# Patient Record
Sex: Male | Born: 1935 | Race: Black or African American | Hispanic: No | Marital: Married | Smoking: Never smoker
Health system: Southern US, Community
[De-identification: ages and names within clinical notes are randomized; demographics above are authoritative.]

## PROBLEM LIST (undated history)

## (undated) DIAGNOSIS — M1990 Unspecified osteoarthritis, unspecified site: Secondary | ICD-10-CM

## (undated) DIAGNOSIS — I10 Essential (primary) hypertension: Secondary | ICD-10-CM

## (undated) DIAGNOSIS — E785 Hyperlipidemia, unspecified: Secondary | ICD-10-CM

## (undated) DIAGNOSIS — G309 Alzheimer's disease, unspecified: Principal | ICD-10-CM

## (undated) DIAGNOSIS — N189 Chronic kidney disease, unspecified: Secondary | ICD-10-CM

## (undated) DIAGNOSIS — I639 Cerebral infarction, unspecified: Secondary | ICD-10-CM

## (undated) DIAGNOSIS — G3184 Mild cognitive impairment, so stated: Principal | ICD-10-CM

## (undated) HISTORY — DX: Alzheimer's disease, unspecified: G30.9

## (undated) HISTORY — DX: Hyperlipidemia, unspecified: E78.5

## (undated) HISTORY — DX: Mild cognitive impairment, so stated: G31.84

## (undated) HISTORY — DX: Unspecified osteoarthritis, unspecified site: M19.90

## (undated) HISTORY — DX: Cerebral infarction, unspecified: I63.9

## (undated) HISTORY — DX: Chronic kidney disease, unspecified: N18.9

## (undated) HISTORY — DX: Essential (primary) hypertension: I10

---

## 2004-11-12 ENCOUNTER — Encounter: Admission: RE | Admit: 2004-11-12 | Discharge: 2004-11-12 | Payer: Self-pay | Admitting: Internal Medicine

## 2005-12-25 ENCOUNTER — Encounter: Payer: Self-pay | Admitting: Family Medicine

## 2005-12-25 LAB — CONVERTED CEMR LAB: PSA: 4.3 ng/mL

## 2006-10-13 ENCOUNTER — Ambulatory Visit: Payer: Self-pay | Admitting: Family Medicine

## 2006-12-15 ENCOUNTER — Ambulatory Visit: Payer: Self-pay | Admitting: Family Medicine

## 2006-12-15 LAB — CONVERTED CEMR LAB
Alkaline Phosphatase: 52 units/L (ref 39–117)
BUN: 15 mg/dL (ref 6–23)
Bilirubin, Direct: 0.1 mg/dL (ref 0.0–0.3)
CO2: 34 meq/L — ABNORMAL HIGH (ref 19–32)
GFR calc Af Amer: 68 mL/min
Potassium: 3.7 meq/L (ref 3.5–5.1)
Total Protein: 6.8 g/dL (ref 6.0–8.3)

## 2007-01-12 ENCOUNTER — Ambulatory Visit: Payer: Self-pay | Admitting: Family Medicine

## 2007-06-11 ENCOUNTER — Encounter (INDEPENDENT_AMBULATORY_CARE_PROVIDER_SITE_OTHER): Payer: Self-pay | Admitting: *Deleted

## 2007-06-14 ENCOUNTER — Encounter: Payer: Self-pay | Admitting: Family Medicine

## 2007-06-14 DIAGNOSIS — Z8679 Personal history of other diseases of the circulatory system: Secondary | ICD-10-CM | POA: Insufficient documentation

## 2007-06-14 DIAGNOSIS — M1990 Unspecified osteoarthritis, unspecified site: Secondary | ICD-10-CM | POA: Insufficient documentation

## 2007-06-14 DIAGNOSIS — E785 Hyperlipidemia, unspecified: Secondary | ICD-10-CM | POA: Insufficient documentation

## 2007-06-14 DIAGNOSIS — I10 Essential (primary) hypertension: Secondary | ICD-10-CM | POA: Insufficient documentation

## 2007-06-15 ENCOUNTER — Ambulatory Visit: Payer: Self-pay | Admitting: Family Medicine

## 2007-06-15 LAB — CONVERTED CEMR LAB
Cholesterol, target level: 200 mg/dL
LDL Goal: 100 mg/dL

## 2007-06-16 DIAGNOSIS — R7309 Other abnormal glucose: Secondary | ICD-10-CM

## 2007-06-16 LAB — CONVERTED CEMR LAB
ALT: 20 units/L (ref 0–53)
AST: 25 units/L (ref 0–37)
BUN: 16 mg/dL (ref 6–23)
Calcium: 9.5 mg/dL (ref 8.4–10.5)
Chloride: 104 meq/L (ref 96–112)
Creatinine, Ser: 1.3 mg/dL (ref 0.4–1.5)
HDL: 41.6 mg/dL (ref >39.0)
VLDL: 17 mg/dL (ref 0–40)

## 2007-06-30 ENCOUNTER — Ambulatory Visit: Payer: Self-pay | Admitting: Family Medicine

## 2007-06-30 LAB — CONVERTED CEMR LAB
BUN: 18 mg/dL (ref 6–23)
Calcium: 9.4 mg/dL (ref 8.4–10.5)
Chloride: 105 meq/L (ref 96–112)
GFR calc Af Amer: 62 mL/min
GFR calc non Af Amer: 51 mL/min

## 2007-08-13 ENCOUNTER — Ambulatory Visit: Payer: Self-pay | Admitting: Family Medicine

## 2007-08-16 ENCOUNTER — Ambulatory Visit: Payer: Self-pay | Admitting: Internal Medicine

## 2007-08-16 LAB — CONVERTED CEMR LAB
Bilirubin Urine: NEGATIVE
Ketones, urine, test strip: NEGATIVE
Urobilinogen, UA: 0.2
pH: 7

## 2007-08-17 ENCOUNTER — Encounter: Payer: Self-pay | Admitting: Internal Medicine

## 2007-08-31 ENCOUNTER — Encounter (INDEPENDENT_AMBULATORY_CARE_PROVIDER_SITE_OTHER): Payer: Self-pay | Admitting: *Deleted

## 2007-09-01 ENCOUNTER — Ambulatory Visit: Payer: Self-pay | Admitting: Family Medicine

## 2007-09-16 ENCOUNTER — Ambulatory Visit: Payer: Self-pay | Admitting: Family Medicine

## 2007-09-16 LAB — CONVERTED CEMR LAB
CO2: 32 meq/L (ref 19–32)
Chloride: 104 meq/L (ref 96–112)
GFR calc non Af Amer: 51 mL/min
Glucose, Bld: 100 mg/dL — ABNORMAL HIGH (ref 70–99)
Sodium: 142 meq/L (ref 135–145)

## 2007-12-30 ENCOUNTER — Ambulatory Visit: Payer: Self-pay | Admitting: Family Medicine

## 2008-01-04 LAB — CONVERTED CEMR LAB
ALT: 24 units/L (ref 0–53)
AST: 29 units/L (ref 0–37)
Basophils Relative: 0.4 % (ref 0.0–1.0)
Bilirubin, Direct: 0.1 mg/dL (ref 0.0–0.3)
CO2: 34 meq/L — ABNORMAL HIGH (ref 19–32)
Calcium: 9.7 mg/dL (ref 8.4–10.5)
Chloride: 102 meq/L (ref 96–112)
Creatinine, Ser: 1.2 mg/dL (ref 0.4–1.5)
Eosinophils Absolute: 0.1 10*3/uL (ref 0.0–0.6)
Eosinophils Relative: 2.2 % (ref 0.0–5.0)
GFR calc non Af Amer: 61 mL/min
Glucose, Bld: 92 mg/dL (ref 70–99)
HCT: 47.4 % (ref 39.0–52.0)
MCV: 93.3 fL (ref 78.0–100.0)
Neutrophils Relative %: 39.4 % — ABNORMAL LOW (ref 43.0–77.0)
Platelets: 267 10*3/uL (ref 150–400)
RBC: 5.09 M/uL (ref 4.22–5.81)
RDW: 12.9 % (ref 11.5–14.6)
Sodium: 141 meq/L (ref 135–145)
Total Bilirubin: 0.8 mg/dL (ref 0.3–1.2)
Total CHOL/HDL Ratio: 3.7
Total Protein: 7.4 g/dL (ref 6.0–8.3)
Triglycerides: 88 mg/dL (ref 0–149)
VLDL: 18 mg/dL (ref 0–40)
Vitamin B-12: 673 pg/mL (ref 211–911)
WBC: 4.5 10*3/uL (ref 4.5–10.5)

## 2008-01-27 ENCOUNTER — Ambulatory Visit: Payer: Self-pay | Admitting: Family Medicine

## 2008-04-26 ENCOUNTER — Ambulatory Visit: Payer: Self-pay | Admitting: Family Medicine

## 2008-05-03 ENCOUNTER — Ambulatory Visit: Payer: Self-pay | Admitting: Family Medicine

## 2008-05-05 LAB — CONVERTED CEMR LAB
BUN: 14 mg/dL (ref 6–23)
Creatinine, Ser: 1.3 mg/dL (ref 0.4–1.5)
GFR calc Af Amer: 67 mL/min
GFR calc non Af Amer: 56 mL/min
Glucose, Bld: 105 mg/dL — ABNORMAL HIGH (ref 70–99)
Potassium: 4.4 meq/L (ref 3.5–5.1)

## 2008-05-21 ENCOUNTER — Ambulatory Visit: Payer: Self-pay | Admitting: Internal Medicine

## 2008-05-21 ENCOUNTER — Inpatient Hospital Stay (HOSPITAL_COMMUNITY): Admission: EM | Admit: 2008-05-21 | Discharge: 2008-05-28 | Payer: Self-pay | Admitting: Emergency Medicine

## 2008-05-23 ENCOUNTER — Ambulatory Visit: Payer: Self-pay | Admitting: Infectious Disease

## 2008-05-25 ENCOUNTER — Encounter: Payer: Self-pay | Admitting: Internal Medicine

## 2008-05-26 ENCOUNTER — Encounter: Payer: Self-pay | Admitting: Family Medicine

## 2008-05-30 ENCOUNTER — Ambulatory Visit: Payer: Self-pay | Admitting: Family Medicine

## 2008-05-31 LAB — CONVERTED CEMR LAB
Alkaline Phosphatase: 60 units/L (ref 39–117)
Basophils Absolute: 0 10*3/uL (ref 0.0–0.1)
Bilirubin, Direct: 0.1 mg/dL (ref 0.0–0.3)
Calcium: 8.8 mg/dL (ref 8.4–10.5)
GFR calc Af Amer: 67 mL/min
HCT: 34.6 % — ABNORMAL LOW (ref 39.0–52.0)
Hemoglobin: 12.2 g/dL — ABNORMAL LOW (ref 13.0–17.0)
Lymphocytes Relative: 30.6 % (ref 12.0–46.0)
MCHC: 35.3 g/dL (ref 30.0–36.0)
Monocytes Absolute: 1 10*3/uL (ref 0.1–1.0)
Neutro Abs: 4.1 10*3/uL (ref 1.4–7.7)
Platelets: 417 10*3/uL — ABNORMAL HIGH (ref 150–400)
Potassium: 3.9 meq/L (ref 3.5–5.1)
RDW: 13.2 % (ref 11.5–14.6)
Sodium: 146 meq/L — ABNORMAL HIGH (ref 135–145)
Total Bilirubin: 0.8 mg/dL (ref 0.3–1.2)

## 2008-06-01 ENCOUNTER — Telehealth: Payer: Self-pay | Admitting: Family Medicine

## 2008-06-05 ENCOUNTER — Ambulatory Visit: Payer: Self-pay | Admitting: Family Medicine

## 2008-06-06 ENCOUNTER — Telehealth: Payer: Self-pay | Admitting: Family Medicine

## 2008-06-13 ENCOUNTER — Ambulatory Visit: Payer: Self-pay | Admitting: Internal Medicine

## 2008-06-13 ENCOUNTER — Ambulatory Visit: Payer: Self-pay | Admitting: Family Medicine

## 2008-06-14 ENCOUNTER — Ambulatory Visit: Payer: Self-pay | Admitting: Family Medicine

## 2008-06-15 ENCOUNTER — Encounter: Payer: Self-pay | Admitting: Internal Medicine

## 2008-06-16 LAB — CONVERTED CEMR LAB
ALT: 32 units/L (ref 0–53)
AST: 28 units/L (ref 0–37)
Alkaline Phosphatase: 58 units/L (ref 39–117)
Bilirubin, Direct: 0.1 mg/dL (ref 0.0–0.3)
CO2: 33 meq/L — ABNORMAL HIGH (ref 19–32)
Chloride: 103 meq/L (ref 96–112)
GFR calc Af Amer: 57 mL/min
Glucose, Bld: 114 mg/dL — ABNORMAL HIGH (ref 70–99)
HCT: 44.8 % (ref 39.0–52.0)
Hemoglobin: 15.1 g/dL (ref 13.0–17.0)
MCHC: 33.7 g/dL (ref 30.0–36.0)
MCV: 94.1 fL (ref 78.0–100.0)
Potassium: 4.5 meq/L (ref 3.5–5.1)
RBC: 4.76 M/uL (ref 4.22–5.81)
Sodium: 142 meq/L (ref 135–145)
Total Protein: 7.1 g/dL (ref 6.0–8.3)

## 2008-06-30 ENCOUNTER — Encounter: Payer: Self-pay | Admitting: Internal Medicine

## 2008-07-05 ENCOUNTER — Encounter: Payer: Self-pay | Admitting: Family Medicine

## 2008-07-27 ENCOUNTER — Encounter: Payer: Self-pay | Admitting: Family Medicine

## 2008-07-27 ENCOUNTER — Encounter (INDEPENDENT_AMBULATORY_CARE_PROVIDER_SITE_OTHER): Payer: Self-pay | Admitting: *Deleted

## 2008-07-28 ENCOUNTER — Ambulatory Visit: Payer: Self-pay | Admitting: Family Medicine

## 2008-07-31 ENCOUNTER — Telehealth: Payer: Self-pay | Admitting: Family Medicine

## 2008-08-08 ENCOUNTER — Ambulatory Visit: Payer: Self-pay | Admitting: Family Medicine

## 2008-08-09 LAB — CONVERTED CEMR LAB
BUN: 16 mg/dL (ref 6–23)
Calcium: 9.5 mg/dL (ref 8.4–10.5)
Chloride: 100 meq/L (ref 96–112)
Creatinine, Ser: 1.3 mg/dL (ref 0.4–1.5)
GFR calc non Af Amer: 56 mL/min

## 2008-09-18 ENCOUNTER — Encounter: Payer: Self-pay | Admitting: Internal Medicine

## 2008-09-24 IMAGING — CT CT ABDOMEN W/ CM
2 of 5 series · 14 of 32 positions shown, 19 images · IV contrast (omni 300/water & 80 ml omni 300)
Comparison: Ultrasound 05/22/2008

CT ABDOMEN

CLINICAL DATA: Sepsis

CT ABDOMEN AND PELVIS WITH CONTRAST
TECHNIQUE: Multidetector CT imaging of the abdomen and pelvis was
performed using the standard protocol following bolus
administration of intravenous contrast.
Contrast: 88 ml [id number]-6LL

[Series 2: routine abdomen · axial · 0.77mm/px · z∈[-409,-84]mm · 6 of 93 slices shown, 11 images]
[im 14/93  soft-tissue]
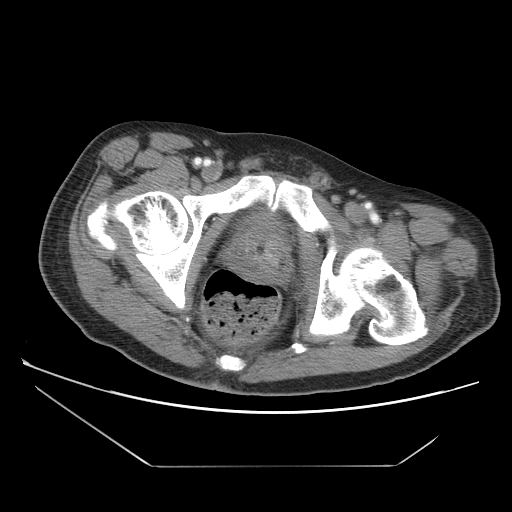
[im 14/93  bone]
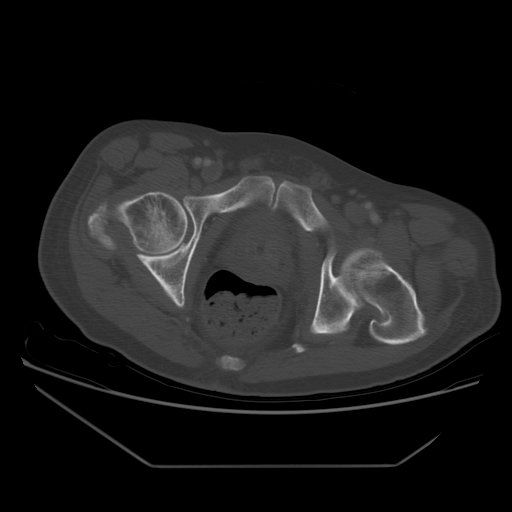
[im 27/93  soft-tissue]
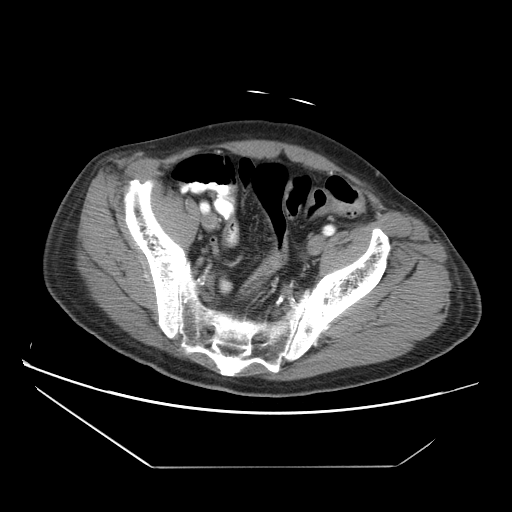
[im 40/93  soft-tissue]
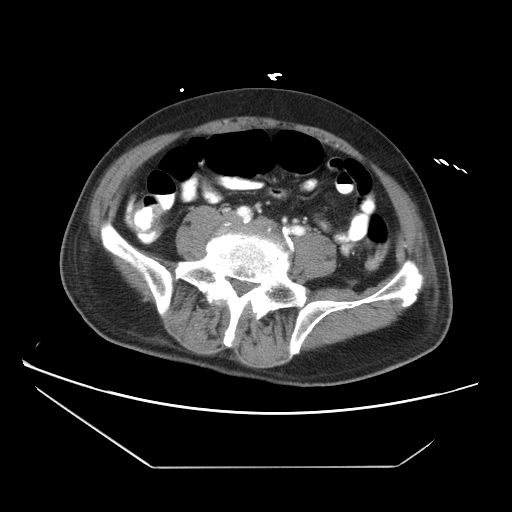
[im 40/93  lung]
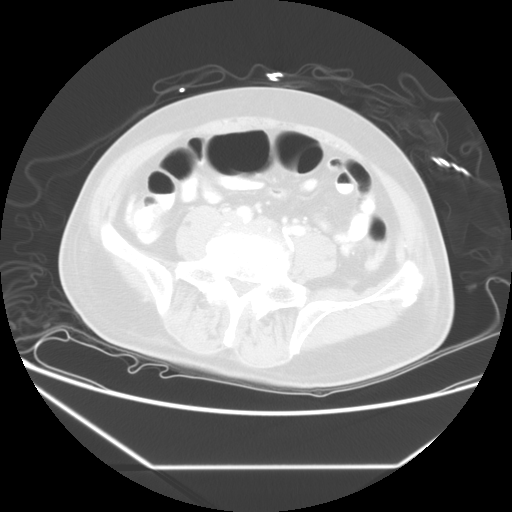
[im 53/93  soft-tissue]
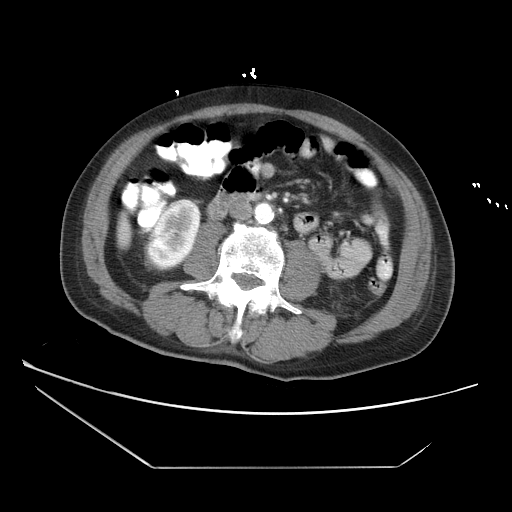
[im 53/93  lung]
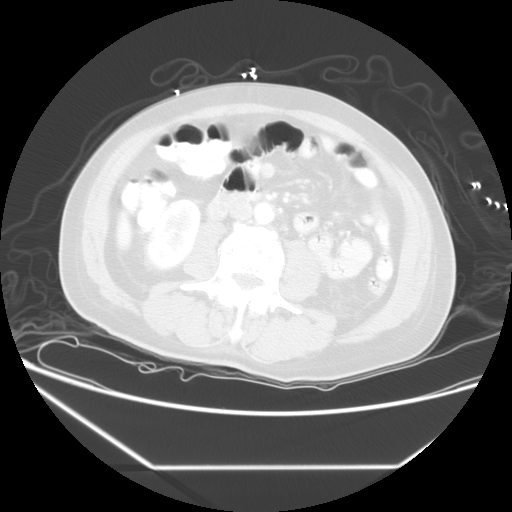
[im 66/93  soft-tissue]
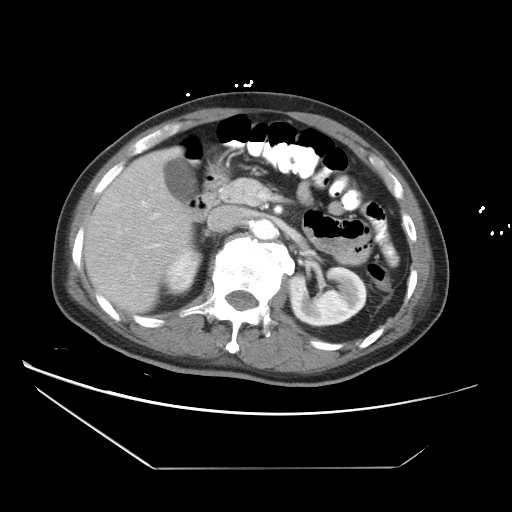
[im 66/93  lung]
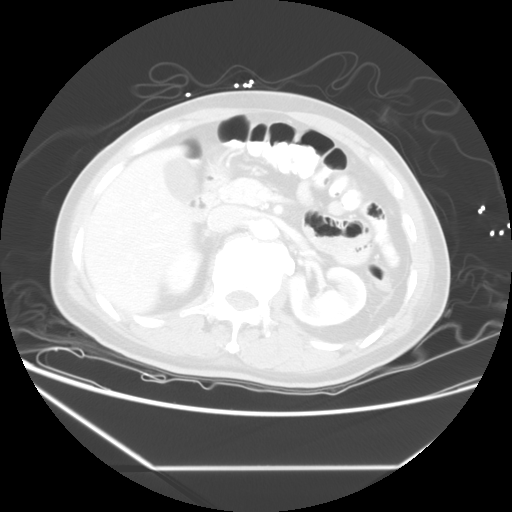
[im 79/93  soft-tissue]
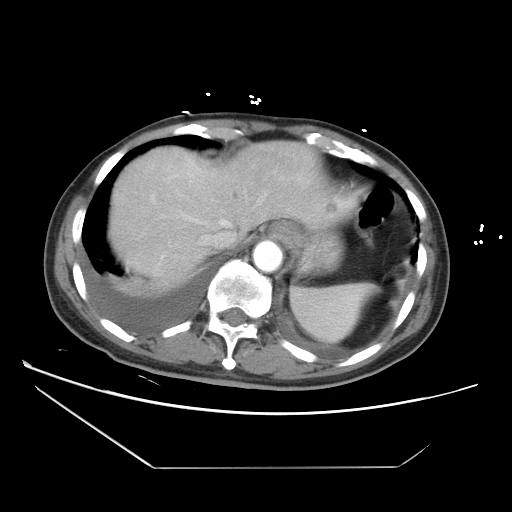
[im 79/93  lung]
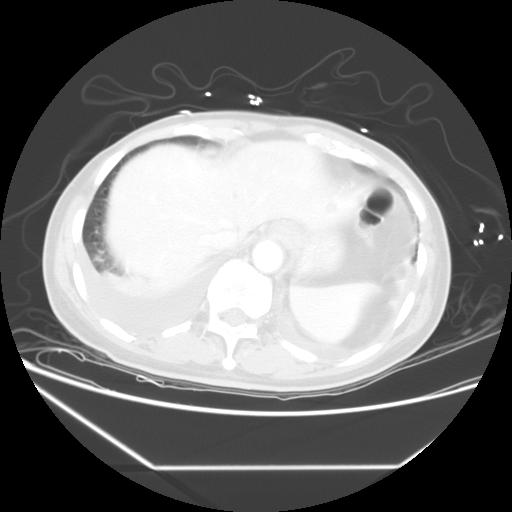

[Series 400: reformatted · sagittal · 0.90mm/px · 8 of 106 slices shown]
[im 12/106  soft-tissue]
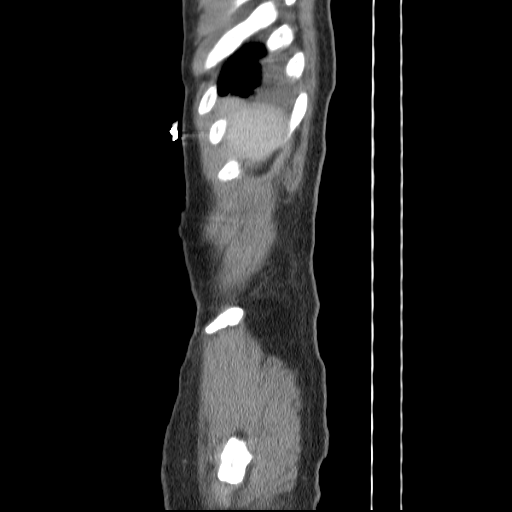
[im 24/106  soft-tissue]
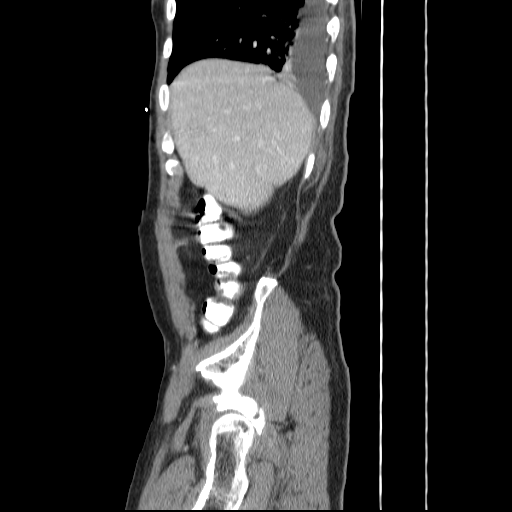
[im 36/106  soft-tissue]
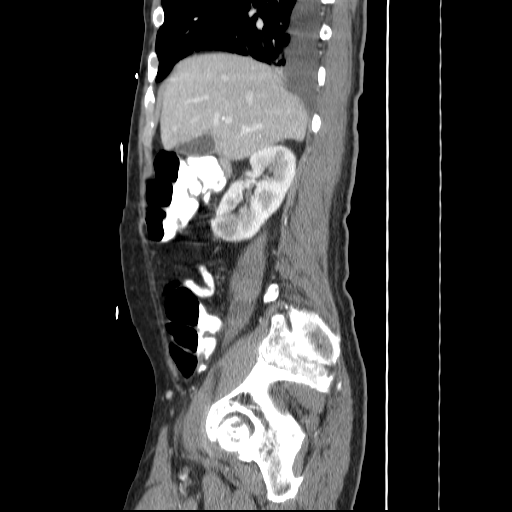
[im 47/106  soft-tissue]
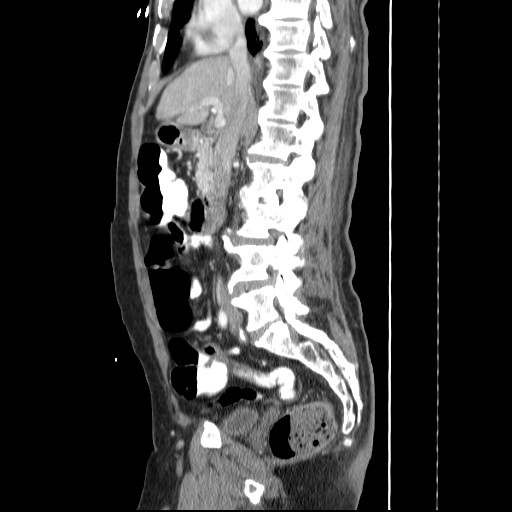
[im 59/106  soft-tissue]
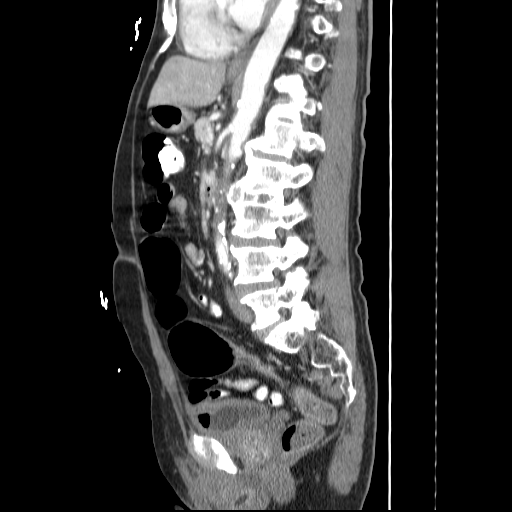
[im 71/106  soft-tissue]
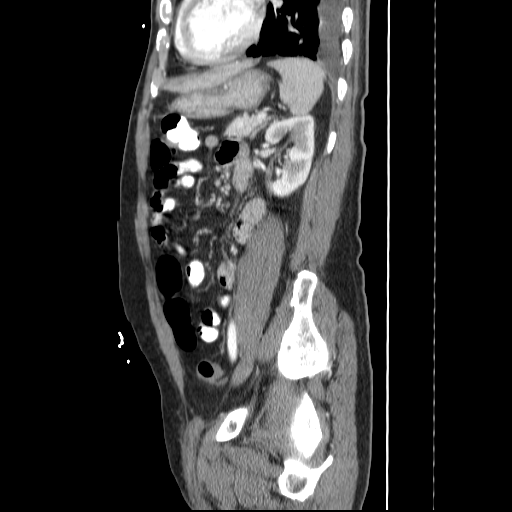
[im 82/106  soft-tissue]
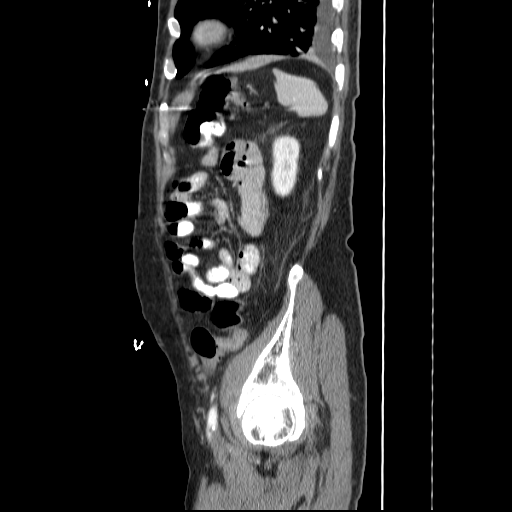
[im 94/106  soft-tissue]
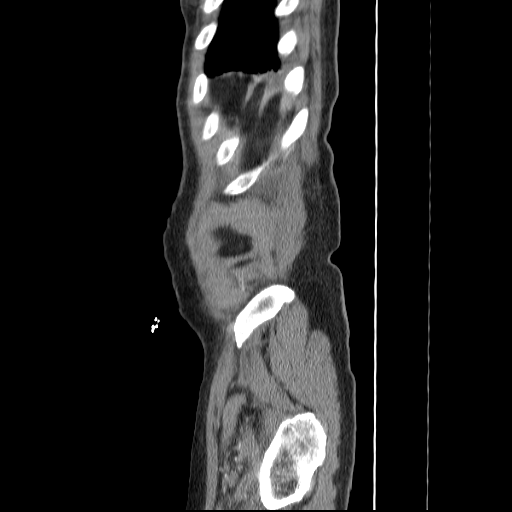

[14 of 32 positions shown; findings below may reference images not displayed]

FINDINGS: There are moderate bilateral pleural effusions, right
slightly larger than left.  Compressive atelectasis in the lower
lobes bilaterally.  Heart borderline in size.  Mild vascular
congestion.

Tiny low density areas in the dome of the liver, difficult to
characterize due to their small size but statistically most likely
small cysts.  Spleen, pancreas, adrenals, kidneys unremarkable.
Gallbladder grossly unremarkable.

Bowel grossly unremarkable.  No free fluid, free air, or
adenopathy. Atherosclerotic irregularity calcifications in the
aorta.  No aneurysm.

Degenerative changes in the spine.  No acute bony abnormality.
IMPRESSION: Moderate bilateral effusions and bibasilar atelectasis.

No acute findings in the abdomen.

CT PELVIS
FINDINGS: Foley catheter present within the bladder.  Appendix is
visualized and is normal. Bowel grossly unremarkable.  No free
fluid, free air, or adenopathy.

No acute bony abnormality.
IMPRESSION: No acute findings in the pelvis.

## 2008-10-31 ENCOUNTER — Ambulatory Visit: Payer: Self-pay | Admitting: Family Medicine

## 2008-10-31 LAB — CONVERTED CEMR LAB
Alkaline Phosphatase: 43 units/L (ref 39–117)
Bilirubin, Direct: 0.1 mg/dL (ref 0.0–0.3)
GFR calc Af Amer: 62 mL/min
GFR calc non Af Amer: 51 mL/min
Glucose, Bld: 107 mg/dL — ABNORMAL HIGH (ref 70–99)
Potassium: 3.8 meq/L (ref 3.5–5.1)
Sodium: 139 meq/L (ref 135–145)
VLDL: 28 mg/dL (ref 0–40)

## 2008-11-08 ENCOUNTER — Ambulatory Visit: Payer: Self-pay | Admitting: Family Medicine

## 2009-02-05 ENCOUNTER — Ambulatory Visit: Payer: Self-pay | Admitting: Family Medicine

## 2009-02-05 LAB — CONVERTED CEMR LAB
HDL: 39.8 mg/dL (ref >39.00)
Total CHOL/HDL Ratio: 4
VLDL: 19.8 mg/dL (ref 0.0–40.0)

## 2009-02-07 ENCOUNTER — Ambulatory Visit: Payer: Self-pay | Admitting: Family Medicine

## 2009-02-07 LAB — CONVERTED CEMR LAB: LDL Goal: 130 mg/dL

## 2009-05-14 ENCOUNTER — Ambulatory Visit: Payer: Self-pay | Admitting: Family Medicine

## 2009-05-14 LAB — HM COLONOSCOPY

## 2009-06-05 ENCOUNTER — Ambulatory Visit: Payer: Self-pay | Admitting: Family Medicine

## 2009-08-10 ENCOUNTER — Ambulatory Visit: Payer: Self-pay | Admitting: Family Medicine

## 2009-08-13 LAB — CONVERTED CEMR LAB
Albumin: 3.8 g/dL (ref 3.5–5.2)
CO2: 32 meq/L (ref 19–32)
Chloride: 101 meq/L (ref 96–112)
HDL: 41.5 mg/dL (ref >39.00)
LDL Cholesterol: 88 mg/dL (ref 0–99)
Sodium: 140 meq/L (ref 135–145)
Total CHOL/HDL Ratio: 4
Triglycerides: 85 mg/dL (ref 0.0–149.0)

## 2009-08-14 ENCOUNTER — Ambulatory Visit: Payer: Self-pay | Admitting: Family Medicine

## 2009-11-16 ENCOUNTER — Ambulatory Visit: Payer: Self-pay | Admitting: Family Medicine

## 2010-02-13 ENCOUNTER — Ambulatory Visit: Payer: Self-pay | Admitting: Family Medicine

## 2010-02-13 LAB — CONVERTED CEMR LAB
Bilirubin, Direct: 0.1 mg/dL (ref 0.0–0.3)
CO2: 33 meq/L — ABNORMAL HIGH (ref 19–32)
Calcium: 9.3 mg/dL (ref 8.4–10.5)
Cholesterol: 140 mg/dL (ref 0–200)
Creatinine, Ser: 1.9 mg/dL — ABNORMAL HIGH (ref 0.4–1.5)
HDL: 40.7 mg/dL (ref >39.00)
Total Bilirubin: 0.6 mg/dL (ref 0.3–1.2)
Total CHOL/HDL Ratio: 3
Total Protein: 7 g/dL (ref 6.0–8.3)
Triglycerides: 141 mg/dL (ref 0.0–149.0)

## 2010-02-19 ENCOUNTER — Ambulatory Visit: Payer: Self-pay | Admitting: Family Medicine

## 2010-02-19 LAB — CONVERTED CEMR LAB
Blood in Urine, dipstick: NEGATIVE
Nitrite: NEGATIVE
Specific Gravity, Urine: <1.005
Urobilinogen, UA: 0.2
WBC Urine, dipstick: NEGATIVE

## 2010-02-26 ENCOUNTER — Ambulatory Visit: Payer: Self-pay | Admitting: Family Medicine

## 2010-02-27 LAB — CONVERTED CEMR LAB
BUN: 32 mg/dL — ABNORMAL HIGH (ref 6–23)
CO2: 31 meq/L (ref 19–32)
Chloride: 102 meq/L (ref 96–112)
Creatinine, Ser: 1.8 mg/dL — ABNORMAL HIGH (ref 0.4–1.5)
Glucose, Bld: 110 mg/dL — ABNORMAL HIGH (ref 70–99)

## 2010-05-30 ENCOUNTER — Ambulatory Visit: Payer: Self-pay | Admitting: Family Medicine

## 2010-11-25 ENCOUNTER — Telehealth (INDEPENDENT_AMBULATORY_CARE_PROVIDER_SITE_OTHER): Payer: Self-pay | Admitting: *Deleted

## 2010-11-27 ENCOUNTER — Other Ambulatory Visit: Payer: Self-pay | Admitting: Family Medicine

## 2010-11-27 ENCOUNTER — Encounter (INDEPENDENT_AMBULATORY_CARE_PROVIDER_SITE_OTHER): Payer: Self-pay | Admitting: *Deleted

## 2010-11-27 ENCOUNTER — Ambulatory Visit: Admit: 2010-11-27 | Payer: Self-pay | Admitting: Family Medicine

## 2010-11-27 ENCOUNTER — Other Ambulatory Visit (INDEPENDENT_AMBULATORY_CARE_PROVIDER_SITE_OTHER): Payer: MEDICARE

## 2010-11-27 DIAGNOSIS — I10 Essential (primary) hypertension: Secondary | ICD-10-CM

## 2010-11-27 LAB — BASIC METABOLIC PANEL
BUN: 24 mg/dL — ABNORMAL HIGH (ref 6–23)
CO2: 32 mEq/L (ref 19–32)
Calcium: 9.8 mg/dL (ref 8.4–10.5)
Creatinine, Ser: 1.7 mg/dL — ABNORMAL HIGH (ref 0.4–1.5)
GFR: 47.82 mL/min — ABNORMAL LOW (ref >60.00)
Glucose, Bld: 90 mg/dL (ref 70–99)
Sodium: 141 mEq/L (ref 135–145)

## 2010-11-27 LAB — HEPATIC FUNCTION PANEL
Albumin: 3.9 g/dL (ref 3.5–5.2)
Total Protein: 7.1 g/dL (ref 6.0–8.3)

## 2010-11-28 NOTE — Assessment & Plan Note (Signed)
Summary: NURSE VISIT BP CHECK/[facility]  Nurse Visit   Vital Signs:  Patient profile:   77 year old male Height:      67 inches Temp:     97.5 degrees F oral BP sitting:   140 / 80  (right arm) Cuff size:   regular  Vitals Entered By: Linde Gillis CMA Duncan Dull) (Feb 26, 2010 10:32 AM) CC: blood pressure check  Patient said that he checked his blood pressure two days ago at [facility] and it was 130/80.  Linde Gillis CMA Duncan Dull)  Feb 26, 2010 10:34 AM   Please have pt continue to follow at home. No med change. Kerby Nora MD  Feb 26, 2010 10:59 AM    Allergies: No Known Drug Allergies

## 2010-11-28 NOTE — Assessment & Plan Note (Signed)
Summary: 30 MIN APPT 3 MONTH FOLLWO UPR/BH   Vital Signs:  Patient profile:   76 year old male Height:      67 inches Weight:      149.2 pounds Temp:     98.1 degrees F oral Pulse rate:   76 / minute Pulse rhythm:   regular BP sitting:   140 / 88  (left arm) Cuff size:   regular  Vitals Entered By: Benny Lennert CMA Duncan Dull) (February 19, 2010 10:53 AM)  History of Present Illness: Chief complaint 3 month follow up  Renal insufficieny...he states he is not drinking much fluids..has tried to increase. No new medciane. No NSAIds. Refuses lab reeval today.Marland Kitchen"I don't want to be stuck again. Given me longer"  Hypertension History:      He denies headache, chest pain, palpitations, dyspnea with exertion, orthopnea, PND, peripheral edema, neurologic problems, syncope, and side effects from treatment.  Not checking at home..took meds 1 hour ago.  Has eaten some high salt foods lately..pigs feet.        Positive major cardiovascular risk factors include male age 45 years old or older, hyperlipidemia, and hypertension.  Negative major cardiovascular risk factors include non-tobacco-user status.     Problems Prior to Update: 1)  Pure Hypercholesterolemia  (ICD-272.0) 2)  Prediabetes  (ICD-790.29) 3)  Degenerative Disc Disease, Neck  (ICD-722.6) 4)  Transient Ischemic Attack, Hx of  (ICD-V12.50) 5)  Renal Insufficiency  (ICD-588.9) 6)  Osteoarthritis  (ICD-715.90) 7)  Hypertension  (ICD-401.9) 8)  Hyperlipidemia  (ICD-272.4)  Current Medications (verified): 1)  Multivitamins   Tabs (Multiple Vitamin) .... Once Daily 2)  Adult Aspirin Ec Low Strength 81 Mg  Tbec (Aspirin) .... Once Daily 3)  Coreg 6.25 Mg  Tabs (Carvedilol) .Marland Kitchen.. 1 Tab By Mouth Two Times A Day 4)  Lisinopril-Hydrochlorothiazide 20-12.5 Mg  Tabs (Lisinopril-Hydrochlorothiazide) .... Take 2 Tablet By Mouth Once A Day 5)  Simvastatin 20 Mg  Tabs (Simvastatin) .Marland Kitchen.. 1 Tab By Mouth Daily  Allergies (verified): No Known Drug  Allergies  Past History:  Past medical, surgical, family and social histories (including risk factors) reviewed, and no changes noted (except as noted below).  Past Medical History: Reviewed history from 06/14/2007 and no changes required. Hyperlipidemia Hypertension Osteoarthritis Renal insufficiency Transient ischemic attack, hx of  Family History: Reviewed history from 06/14/2007 and no changes required. Father: Died 60, healthy, MI Mother: Died 85 [place] Siblings: 1 brother ? 3 sisters, 2 died of old age, one alive at age 52  Social History: Reviewed history from 06/14/2007 and no changes required. Never Smoked Alcohol use-no Drug use-no Regular exercise-yes, gardening, very active Diet:  Fair eater, (+) fruit/veggies but only eats once a day, (+) H2O Marital Status: Married x 53 years Children: 6, healthy Occupation: Paediatric nurse, truck Hospital doctor  Review of Systems General:  Denies fatigue and fever. CV:  Denies chest pain or discomfort. Resp:  Denies chest pain with inspiration, shortness of breath, sputum productive, and wheezing. GI:  Denies abdominal pain, bloody stools, constipation, and diarrhea. GU:  Denies dysuria.  Physical Exam  General:  elderly male in NAd Nose:  External nasal examination shows no deformity or inflammation. Nasal mucosa are pink and moist without lesions or exudates. Mouth:  MMM Neck:  no carotid bruit or thyromegaly no cervical or supraclavicular lymphadenopathy  Lungs:  Normal respiratory effort, chest expands symmetrically. Lungs are clear to auscultation, no crackles or wheezes. Heart:  Normal rate and regular rhythm. S1 and S2 normal without  gallop, murmur, click, rub or other extra sounds. Abdomen:  Bowel sounds positive,abdomen soft and non-tender without masses, organomegaly or hernias noted. Pulses:  R and L posterior tibial pulses are full and equal bilaterally  Extremities:  no edema   Impression & Recommendations:  Problem  # 1:  HYPERTENSION (ICD-401.9) Initail check 170/90.Marland Kitchenrecehck lower at 140. Follow at home. Call  if above goal 140/90. ? connected to recent decrease in kidney function.  Encouraged exercise, weight loss, healthy eating habits.  His updated medication list for this problem includes:    Coreg 6.25 Mg Tabs (Carvedilol) .Marland Kitchen... 1 tab by mouth two times a day    Lisinopril-hydrochlorothiazide 20-12.5 Mg Tabs (Lisinopril-hydrochlorothiazide) .Marland Kitchen... Take 2 tablet by mouth once a day  Problem # 2:  PURE HYPERCHOLESTEROLEMIA (ICD-272.0)  Well controlled. Continue current medication.  His updated medication list for this problem includes:    Simvastatin 20 Mg Tabs (Simvastatin) .Marland Kitchen... 1 tab by mouth daily  Labs Reviewed: SGOT: 21 (02/13/2010)   SGPT: 18 (02/13/2010)  Lipid Goals: Chol Goal: 200 (06/15/2007)   HDL Goal: 40 (06/15/2007)   LDL Goal: 130 (02/07/2009)   TG Goal: 150 (06/15/2007)  10 Yr Risk Heart Disease: 18 % Prior 10 Yr Risk Heart Disease: 14 % (11/16/2009)   HDL:40.70 (02/13/2010), 41.50 (08/10/2009)  LDL:71 (02/13/2010), 88 (08/10/2009)  Chol:140 (02/13/2010), 146 (08/10/2009)  Trig:141.0 (02/13/2010), 85.0 (08/10/2009)  Problem # 3:  RENAL INSUFFICIENCY (ICD-588.9) No sign of infection.  Refuses reeval today..recheck in 1 week for worsening. Avoid NSAIDs. Increase water intake..? mild dehydration. Orders: UA Dipstick W/ Micro (manual) (16109)  Problem # 4:  PREDIABETES (ICD-790.29) Well controlled.  Labs Reviewed: Creat: 1.9 (02/13/2010)     Complete Medication List: 1)  Multivitamins Tabs (Multiple vitamin) .... Once daily 2)  Adult Aspirin Ec Low Strength 81 Mg Tbec (Aspirin) .... Once daily 3)  Coreg 6.25 Mg Tabs (Carvedilol) .Marland Kitchen.. 1 tab by mouth two times a day 4)  Lisinopril-hydrochlorothiazide 20-12.5 Mg Tabs (Lisinopril-hydrochlorothiazide) .... Take 2 tablet by mouth once a day 5)  Simvastatin 20 Mg Tabs (Simvastatin) .Marland Kitchen.. 1 tab by mouth daily  Hypertension  Assessment/Plan:      The patient's hypertensive risk group is category B: At least one risk factor (excluding diabetes) with no target organ damage.  His calculated 10 year risk of coronary heart disease is 18 %.  Today's blood pressure is 140/88.  His blood pressure goal is < 140/90.  Patient Instructions: 1)  Return in 1 week for BMET Dx 401.1 2)  RN vist same day for BP check. 3)   Follow up in 27month 30 min OV. 4)  Check BP at pharmacy in next 2 weeks. 5)  Call if greater than 140/90.  Current Allergies (reviewed today): No known allergies   Laboratory Results   Urine Tests  Date/Time Received: February 19, 2010 11:36 AM  Date/Time Reported: February 19, 2010 11:36 AM   Routine Urinalysis   Color: lt. yellow Appearance: Clear Glucose: negative   (Normal Range: Negative) Bilirubin: negative   (Normal Range: Negative) Ketone: negative   (Normal Range: Negative) Spec. Gravity: <1.005   (Normal Range: 1.003-1.035) Blood: negative   (Normal Range: Negative) pH: 5.0   (Normal Range: 5.0-8.0) Protein: negative   (Normal Range: Negative) Urobilinogen: 0.2   (Normal Range: 0-1) Nitrite: negative   (Normal Range: Negative) Leukocyte Esterace: negative   (Normal Range: Negative)

## 2010-11-28 NOTE — Assessment & Plan Note (Signed)
Summary: 30 MIN APPT 3 MONTH FOLLOW UP/[facility]   Vital Signs:  Patient profile:   77 year old male Height:      67 inches Weight:      137.8 pounds BMI:     21.66 Temp:     98.9 degrees F oral Pulse rate:   76 / minute Pulse rhythm:   regular BP sitting:   138 / 80  (left arm) Cuff size:   regular  Vitals Entered By: Benny Lennert CMA Duncan Dull) (May 30, 2010 8:41 AM)  History of Present Illness: Chief complaint 3 month follow up   HTN, well controlled on current medicaiton.  Recent eye MD exam..has cataracts in both eyes...recommended surgery.  Last labs.Marland Kitchenelectrolytes and kidney fnction stable.  Working outside in garden...drinking fluids.   Discussed end of life issues: He wishes to be FULL CODE. Info given on living will and DNR, HCPOA.  He will discuss with family...we will set up end of life plans at next OV.  Problems Prior to Update: 1)  Pure Hypercholesterolemia  (ICD-272.0) 2)  Prediabetes  (ICD-790.29) 3)  Degenerative Disc Disease, Neck  (ICD-722.6) 4)  Transient Ischemic Attack, Hx of  (ICD-V12.50) 5)  Renal Insufficiency  (ICD-588.9) 6)  Osteoarthritis  (ICD-715.90) 7)  Hypertension  (ICD-401.9) 8)  Hyperlipidemia  (ICD-272.4)  Current Medications (verified): 1)  Multivitamins   Tabs (Multiple Vitamin) .... Once Daily 2)  Adult Aspirin Ec Low Strength 81 Mg  Tbec (Aspirin) .... Once Daily 3)  Coreg 6.25 Mg  Tabs (Carvedilol) .Marland Kitchen.. 1 Tab By Mouth Two Times A Day 4)  Lisinopril-Hydrochlorothiazide 20-12.5 Mg  Tabs (Lisinopril-Hydrochlorothiazide) .... Take 2 Tablet By Mouth Once A Day 5)  Simvastatin 20 Mg  Tabs (Simvastatin) .Marland Kitchen.. 1 Tab By Mouth Daily  Allergies (verified): No Known Drug Allergies  Past History:  Past medical, surgical, family and social histories (including risk factors) reviewed, and no changes noted (except as noted below).  Past Medical History: Reviewed history from 06/14/2007 and no changes  required. Hyperlipidemia Hypertension Osteoarthritis Renal insufficiency Transient ischemic attack, hx of  Family History: Reviewed history from 06/14/2007 and no changes required. Father: Died 82, healthy, MI Mother: Died 27 [place] Siblings: 1 brother ? 3 sisters, 2 died of old age, one alive at age 21  Social History: Reviewed history from 06/14/2007 and no changes required. Never Smoked Alcohol use-no Drug use-no Regular exercise-yes, gardening, very active Diet:  Fair eater, (+) fruit/veggies but only eats once a day, (+) H2O Marital Status: Married x 53 years Children: 6, healthy Occupation: Paediatric nurse, truck Hospital doctor  Review of Systems General:  Denies fatigue and fever. CV:  Denies chest pain or discomfort. Resp:  Denies shortness of breath. GI:  Denies abdominal pain. GU:  Denies dysuria; Some urinary frequency and nocturia..but not bothering him at all.. Derm:  Denies lesion(s).  Physical Exam  General:  elderly male in NAd Mouth:  MMM Neck:  no carotid bruit or thyromegaly no cervical or supraclavicular lymphadenopathy  Lungs:  Normal respiratory effort, chest expands symmetrically. Lungs are clear to auscultation, no crackles or wheezes. Heart:  Normal rate and regular rhythm. S1 and S2 normal without gallop, murmur, click, rub or other extra sounds. Abdomen:  Bowel sounds positive,abdomen soft and non-tender without masses, organomegaly or hernias noted. Pulses:  R and L posterior tibial pulses are full and equal bilaterally  Extremities:  no edema Skin:  Intact without suspicious lesions or rashes Psych:  Cognition and judgment appear intact. Alert and  cooperative with normal attention span and concentration. No apparent delusions, illusions, hallucinations   Impression & Recommendations:  Problem # 1:  HYPERTENSION (ICD-401.9) Well controlled. Continue current medication.  His updated medication list for this problem includes:    Coreg 6.25 Mg Tabs  (Carvedilol) .Marland Kitchen... 1 tab by mouth two times a day    Lisinopril-hydrochlorothiazide 20-12.5 Mg Tabs (Lisinopril-hydrochlorothiazide) .Marland Kitchen... Take 2 tablet by mouth once a day  Problem # 2:  RENAL INSUFFICIENCY (ICD-588.9) Stable last check.   Problem # 3:  ADVANCE DIRECTIVE DISCUSSION Info given. pt to discuss with family.  Will complete form for full code and othe infos when he returns to next appt.   Problem # 4:  PURE HYPERCHOLESTEROLEMIA (ICD-272.0) Well controlled. Continue current medication. Will continue this med in this pt given Hx of CVA. Yearly testing.  His updated medication list for this problem includes:    Simvastatin 20 Mg Tabs (Simvastatin) .Marland Kitchen... 1 tab by mouth daily  Complete Medication List: 1)  Multivitamins Tabs (Multiple vitamin) .... Once daily 2)  Adult Aspirin Ec Low Strength 81 Mg Tbec (Aspirin) .... Once daily 3)  Coreg 6.25 Mg Tabs (Carvedilol) .Marland Kitchen.. 1 tab by mouth two times a day 4)  Lisinopril-hydrochlorothiazide 20-12.5 Mg Tabs (Lisinopril-hydrochlorothiazide) .... Take 2 tablet by mouth once a day 5)  Simvastatin 20 Mg Tabs (Simvastatin) .Marland Kitchen.. 1 tab by mouth daily  Patient Instructions: 1)  Please schedule a follow-up appointment in 6 months  for CPX 30 min OV   Current Allergies (reviewed today): No known allergies

## 2010-11-28 NOTE — Assessment & Plan Note (Signed)
Summary: 3 MONTH FOLLOW UP/[facility]   Vital Signs:  Patient profile:   77 year old male Height:      67 inches Weight:      144.8 pounds Temp:     98.1 degrees F oral Pulse rate:   76 / minute Pulse rhythm:   regular BP sitting:   130 / 80  (left arm) Cuff size:   regular  Vitals Entered By: Benny Lennert CMA Duncan Dull) (November 16, 2009 8:29 AM)  History of Present Illness: Chief complaint 3 month follow up     Hypertension History:      He denies headache, chest pain, dyspnea with exertion, peripheral edema, and side effects from treatment.  Bp well controlle dat home. Marland Kitchen        Positive major cardiovascular risk factors include male age 36 years old or older, hyperlipidemia, and hypertension.  Negative major cardiovascular risk factors include non-tobacco-user status.     Problems Prior to Update: 1)  Pure Hypercholesterolemia  (ICD-272.0) 2)  Uri  (ICD-465.9) 3)  Cerumen Impaction, Bilateral  (ICD-380.4) 4)  Prediabetes  (ICD-790.29) 5)  Degenerative Disc Disease, Neck  (ICD-722.6) 6)  Transient Ischemic Attack, Hx of  (ICD-V12.50) 7)  Renal Insufficiency  (ICD-588.9) 8)  Osteoarthritis  (ICD-715.90) 9)  Hypertension  (ICD-401.9) 10)  Hyperlipidemia  (ICD-272.4)  Current Medications (verified): 1)  Multivitamins   Tabs (Multiple Vitamin) .... Once Daily 2)  Adult Aspirin Ec Low Strength 81 Mg  Tbec (Aspirin) .... Once Daily 3)  Coreg 6.25 Mg  Tabs (Carvedilol) .Marland Kitchen.. 1 Tab By Mouth Two Times A Day 4)  Lisinopril-Hydrochlorothiazide 20-12.5 Mg  Tabs (Lisinopril-Hydrochlorothiazide) .... Take 2 Tablet By Mouth Once A Day 5)  Simvastatin 20 Mg  Tabs (Simvastatin) .Marland Kitchen.. 1 Tab By Mouth Daily  Allergies (verified): No Known Drug Allergies  Past History:  Past medical, surgical, family and social histories (including risk factors) reviewed, and no changes noted (except as noted below).  Past Medical History: Reviewed history from 06/14/2007 and no changes  required. Hyperlipidemia Hypertension Osteoarthritis Renal insufficiency Transient ischemic attack, hx of  Family History: Reviewed history from 06/14/2007 and no changes required. Father: Died 59, healthy, MI Mother: Died 22 [place] Siblings: 1 brother ? 3 sisters, 2 died of old age, one alive at age 15  Social History: Reviewed history from 06/14/2007 and no changes required. Never Smoked Alcohol use-no Drug use-no Regular exercise-yes, gardening, very active Diet:  Fair eater, (+) fruit/veggies but only eats once a day, (+) H2O Marital Status: Married x 53 years Children: 6, healthy Occupation: Paediatric nurse, truck Hospital doctor  Review of Systems General:  Denies fatigue. CV:  Denies swelling of feet. Resp:  Denies sputum productive and wheezing. GI:  Denies abdominal pain, bloody stools, constipation, and diarrhea. GU:  Complains of nocturia and urinary frequency; denies dysuria and hematuria; Not bothering him enough to take a medicaiton. . Derm:  Denies rash. Psych:  Denies anxiety and depression; food doesn't taste good to him as much in past 10 years.Marland Kitchenor wife is not cooking as well. Marland Kitchen  Physical Exam  General:  elderly male in NAd Nose:  External nasal examination shows no deformity or inflammation. Nasal mucosa are pink and moist without lesions or exudates. Mouth:  MMM Neck:  no carotid bruit or thyromegaly no cervical or supraclavicular lymphadenopathy  Lungs:  Normal respiratory effort, chest expands symmetrically. Lungs are clear to auscultation, no crackles or wheezes. Heart:  Normal rate and regular rhythm. S1 and S2 normal without  gallop, murmur, click, rub or other extra sounds. Pulses:  R and L posterior tibial pulses are full and equal bilaterally  Extremities:  no edema   Impression & Recommendations:  Problem # 1:  HYPERTENSION (ICD-401.9) Well controlled on current medicaiton.  His updated medication list for this problem includes:    Coreg 6.25 Mg Tabs  (Carvedilol) .Marland Kitchen... 1 tab by mouth two times a day    Lisinopril-hydrochlorothiazide 20-12.5 Mg Tabs (Lisinopril-hydrochlorothiazide) .Marland Kitchen... Take 2 tablet by mouth once a day  Problem # 2:  HYPERLIPIDEMIA (ICD-272.4) Assessment: Comment Only Due for reeval ...with history of TIA.Marland Kitchengoal <70 LDL if pt wishes to continue to be aggressive.  His updated medication list for this problem includes:    Simvastatin 20 Mg Tabs (Simvastatin) .Marland Kitchen... 1 tab by mouth daily  Problem # 5:  PREDIABETES (ICD-790.29) Assessment: Comment Only Will eval next OV.   Complete Medication List: 1)  Multivitamins Tabs (Multiple vitamin) .... Once daily 2)  Adult Aspirin Ec Low Strength 81 Mg Tbec (Aspirin) .... Once daily 3)  Coreg 6.25 Mg Tabs (Carvedilol) .Marland Kitchen.. 1 tab by mouth two times a day 4)  Lisinopril-hydrochlorothiazide 20-12.5 Mg Tabs (Lisinopril-hydrochlorothiazide) .... Take 2 tablet by mouth once a day 5)  Simvastatin 20 Mg Tabs (Simvastatin) .Marland Kitchen.. 1 tab by mouth daily  Hypertension Assessment/Plan:      The patient's hypertensive risk group is category B: At least one risk factor (excluding diabetes) with no target organ damage.  His calculated 10 year risk of coronary heart disease is 14 %.  Today's blood pressure is 130/80.  His blood pressure goal is < 140/90.  Patient Instructions: 1)  Follow up appt in 3 months..chronic med issues...30 min appt. 2)  Fasting lipids, CMET Dx 272.0  Current Allergies (reviewed today): No known allergies

## 2010-12-04 NOTE — Progress Notes (Signed)
----   Converted from flag ---- ---- 11/22/2010 5:12 PM, Kerby Nora MD wrote: CMET Dx 401.1  ---- 11/22/2010 8:45 AM, Liane Comber CMA (AAMA) wrote: Lab orders please! Good Morning! This pt is scheduled for cpx labs Wed, which labs to draw and dx codes to use? Thanks Tasha ------------------------------

## 2010-12-09 ENCOUNTER — Encounter: Payer: Self-pay | Admitting: Family Medicine

## 2010-12-09 ENCOUNTER — Encounter (INDEPENDENT_AMBULATORY_CARE_PROVIDER_SITE_OTHER): Payer: MEDICARE | Admitting: Family Medicine

## 2010-12-09 DIAGNOSIS — N183 Chronic kidney disease, stage 3 (moderate): Secondary | ICD-10-CM

## 2010-12-09 DIAGNOSIS — M545 Low back pain: Secondary | ICD-10-CM | POA: Insufficient documentation

## 2010-12-09 DIAGNOSIS — E785 Hyperlipidemia, unspecified: Secondary | ICD-10-CM

## 2010-12-09 DIAGNOSIS — I10 Essential (primary) hypertension: Secondary | ICD-10-CM

## 2010-12-09 DIAGNOSIS — R7309 Other abnormal glucose: Secondary | ICD-10-CM

## 2010-12-18 NOTE — Assessment & Plan Note (Signed)
Summary: CPX/RBH   Vital Signs:  Patient profile:   77 year old male Weight:      141.50 pounds Temp:     98.7 degrees F oral Pulse rate:   68 / minute Pulse rhythm:   regular BP sitting:   118 / 60  (left arm) Cuff size:   regular  Vitals Entered By: Sydell Axon LPN (December 09, 2010 11:18 AM) CC: 30 minute checkup   History of Present Illness: HTN, well controlled on current medicaiton. Recent eye MD exam..has cataracts in both eyes...recommended surgery.  Last labs.Marland Kitchenelectrolytes and kidney fnction stable.  CKD, stable at recent check.  3 days ago when lifting lumbar.. he had sharp pain on right side low back. No numbness, no tingling. No radiating pain, no weakness in leg.  Goes away some druing the day.  Gradually improving over last few days.. 1/10 on pain scale. Using tylenol for pain.  Discussed end of life issues: He wishes to be FULL CODE. Info given on living will and DNR, HCPOA.  He will discuss with family...we will set up end of life plans at next OV.  Poor hearing... has history of wax issue.... not interested in hearing aid.   Problems Prior to Update: 1)  Pure Hypercholesterolemia  (ICD-272.0) 2)  Prediabetes  (ICD-790.29) 3)  Degenerative Disc Disease, Neck  (ICD-722.6) 4)  Transient Ischemic Attack, Hx of  (ICD-V12.50) 5)  Renal Insufficiency  (ICD-588.9) 6)  Osteoarthritis  (ICD-715.90) 7)  Hypertension  (ICD-401.9) 8)  Hyperlipidemia  (ICD-272.4)  Current Medications (verified): 1)  Multivitamins   Tabs (Multiple Vitamin) .... Once Daily 2)  Adult Aspirin Ec Low Strength 81 Mg  Tbec (Aspirin) .... Once Daily 3)  Coreg 6.25 Mg  Tabs (Carvedilol) .Marland Kitchen.. 1 Tab By Mouth Two Times A Day 4)  Lisinopril-Hydrochlorothiazide 20-12.5 Mg  Tabs (Lisinopril-Hydrochlorothiazide) .... Take 2 Tablet By Mouth Once A Day 5)  Simvastatin 20 Mg  Tabs (Simvastatin) .Marland Kitchen.. 1 Tab By Mouth Daily  Allergies (verified): No Known Drug Allergies  Past History:  Past  medical, surgical, family and social histories (including risk factors) reviewed, and no changes noted (except as noted below).  Past Medical History: Reviewed history from 06/14/2007 and no changes required. Hyperlipidemia Hypertension Osteoarthritis Renal insufficiency Transient ischemic attack, hx of  Family History: Reviewed history from 06/14/2007 and no changes required. Father: Died 70, healthy, MI Mother: Died 68 [place] Siblings: 1 brother ? 3 sisters, 2 died of old age, one alive at age 30  Social History: Reviewed history from 06/14/2007 and no changes required. Never Smoked Alcohol use-no Drug use-no Regular exercise-yes, gardening, very active Diet:  Fair eater, (+) fruit/veggies but only eats once a day, (+) H2O Marital Status: Married x 53 years Children: 6, healthy Occupation: Paediatric nurse, truck Hospital doctor  Review of Systems General:  Denies fatigue and fever. CV:  Denies chest pain or discomfort. Resp:  Denies shortness of breath, sputum productive, and wheezing. GI:  Denies abdominal pain, bloody stools, constipation, and diarrhea. GU:  Denies dysuria and urinary frequency. Psych:  Denies anxiety and depression.  Physical Exam  General:  elderly male in NAd Ears:  B cerumen impaction, post irrigation, B TMs clear, no external deformities.   Mouth:  MMM Neck:  no carotid bruit or thyromegaly no cervical or supraclavicular lymphadenopathy  Lungs:  Normal respiratory effort, chest expands symmetrically. Lungs are clear to auscultation, no crackles or wheezes. Heart:  Normal rate and regular rhythm. S1 and S2 normal without gallop, murmur,  click, rub or other extra sounds. Abdomen:  Bowel sounds positive,abdomen soft and non-tender without masses, organomegaly or hernias noted. Msk:  no central vertebral ttp,  ttp over right paraspinous muscle in lumbar sine, neg SLR, full strength in lower extremeties, neg Faber's  Pulses:  R and L posterior tibial pulses are full  and equal bilaterally  Extremities:  no edema   Impression & Recommendations:  Problem # 1:  PREDIABETES (ICD-790.29)  Resolved with diet change.  Labs Reviewed: Creat: 1.7 (11/27/2010)     Problem # 2:  PURE HYPERCHOLESTEROLEMIA (ICD-272.0) Still on med due to history of CVA... reheck prior to next appt. His updated medication list for this problem includes:    Simvastatin 20 Mg Tabs (Simvastatin) .Marland Kitchen... 1 tab by mouth daily  Problem # 3:  HYPERTENSION (ICD-401.9) Well controlled. Continue current medication.  His updated medication list for this problem includes:    Coreg 6.25 Mg Tabs (Carvedilol) .Marland Kitchen... 1 tab by mouth two times a day    Lisinopril-hydrochlorothiazide 20-12.5 Mg Tabs (Lisinopril-hydrochlorothiazide) .Marland Kitchen... Take 2 tablet by mouth once a day  BP today: 118/60 Prior BP: 138/80 (05/30/2010)  Prior 10 Yr Risk Heart Disease: 18 % (02/19/2010)  Labs Reviewed: K+: 4.1 (11/27/2010) Creat: : 1.7 (11/27/2010)   Chol: 140 (02/13/2010)   HDL: 40.70 (02/13/2010)   LDL: 71 (02/13/2010)   TG: 141.0 (02/13/2010)  Problem # 4:  LOW BACK PAIN, ACUTE (ICD-724.2) Muscle strain in right parraspionous muscle. Avoid NSAIDs given CKD. Tylenol as needed pain. Apply heat and gentle stretching info given.  His updated medication list for this problem includes:    Adult Aspirin Ec Low Strength 81 Mg Tbec (Aspirin) ..... Once daily  Problem # 5:  CHRONIC KIDNEY DISEASE STAGE III (MODERATE) (ICD-585.3) stable.   Complete Medication List: 1)  Multivitamins Tabs (Multiple vitamin) .... Once daily 2)  Adult Aspirin Ec Low Strength 81 Mg Tbec (Aspirin) .... Once daily 3)  Coreg 6.25 Mg Tabs (Carvedilol) .Marland Kitchen.. 1 tab by mouth two times a day 4)  Lisinopril-hydrochlorothiazide 20-12.5 Mg Tabs (Lisinopril-hydrochlorothiazide) .... Take 2 tablet by mouth once a day 5)  Simvastatin 20 Mg Tabs (Simvastatin) .Marland Kitchen.. 1 tab by mouth daily  Patient Instructions: 1)  Please schedule a follow-up  appointment in 3 months for annual medicare wellness. 2)  Fasting lipids, CMET prior Dx 272.0   Orders Added: 1)  Est. Patient Level IV [60454]    Current Allergies (reviewed today): No known allergies

## 2011-03-04 ENCOUNTER — Other Ambulatory Visit: Payer: Self-pay | Admitting: Family Medicine

## 2011-03-04 DIAGNOSIS — E7800 Pure hypercholesterolemia, unspecified: Secondary | ICD-10-CM

## 2011-03-06 ENCOUNTER — Other Ambulatory Visit (INDEPENDENT_AMBULATORY_CARE_PROVIDER_SITE_OTHER): Payer: MEDICARE | Admitting: Family Medicine

## 2011-03-06 DIAGNOSIS — E7800 Pure hypercholesterolemia, unspecified: Secondary | ICD-10-CM

## 2011-03-06 LAB — COMPREHENSIVE METABOLIC PANEL
ALT: 21 U/L (ref 0–53)
AST: 25 U/L (ref 0–37)
Albumin: 3.7 g/dL (ref 3.5–5.2)
BUN: 25 mg/dL — ABNORMAL HIGH (ref 6–23)
Calcium: 9.3 mg/dL (ref 8.4–10.5)
Chloride: 100 mEq/L (ref 96–112)
Potassium: 4.1 mEq/L (ref 3.5–5.1)
Sodium: 138 mEq/L (ref 135–145)
Total Protein: 6.8 g/dL (ref 6.0–8.3)

## 2011-03-06 LAB — LIPID PANEL
Cholesterol: 162 mg/dL (ref 0–200)
LDL Cholesterol: 101 mg/dL — ABNORMAL HIGH (ref 0–99)

## 2011-03-07 ENCOUNTER — Encounter: Payer: Self-pay | Admitting: Family Medicine

## 2011-03-11 ENCOUNTER — Encounter: Payer: Self-pay | Admitting: Family Medicine

## 2011-03-11 ENCOUNTER — Ambulatory Visit (INDEPENDENT_AMBULATORY_CARE_PROVIDER_SITE_OTHER): Payer: Self-pay | Admitting: Family Medicine

## 2011-03-11 DIAGNOSIS — Z0000 Encounter for general adult medical examination without abnormal findings: Secondary | ICD-10-CM

## 2011-03-11 DIAGNOSIS — I10 Essential (primary) hypertension: Secondary | ICD-10-CM

## 2011-03-11 DIAGNOSIS — R7309 Other abnormal glucose: Secondary | ICD-10-CM

## 2011-03-11 DIAGNOSIS — E785 Hyperlipidemia, unspecified: Secondary | ICD-10-CM

## 2011-03-11 NOTE — Assessment & Plan Note (Signed)
Well controlled. Continue current medication.  

## 2011-03-11 NOTE — Discharge Summary (Signed)
NAME:  Mike Walton, Mike Walton NO.:  [id number]   MEDICAL RECORD NO.:  [id number]          PATIENT TYPE:  INP   LOCATION:  2014                         FACILITY:  [facility]   PHYSICIAN:  Gordy Savers, MDDATE OF BIRTH:  1920-01-24   DATE OF ADMISSION:  05/21/2008  DATE OF DISCHARGE:  05/28/2008                               DISCHARGE SUMMARY   FINAL DIAGNOSIS:  Acute febrile illness [facility] spotted fever  versus Ehrlichia likely).   ADDITIONAL DIAGNOSES:  Heart failure, hypertension, dyslipidemia,  elevated liver function studies.   DISCHARGE MEDICATIONS:  1. Doxycycline 100 mg b.i.d. for seven additional days.  2. Coreg 3.125 mg b.i.d.   HISTORY OF PRESENT ILLNESS:  The patient is an 77 year old African-  American male who presented with a 3-day history of a febrile illness.  The patient was subsequently admitted to the hospital for further  evaluation and treatment.  On arrival, temperature is 103.6 degrees.   HOSPITAL COURSE:  The patient was seen in consultation by Infectious  Disease.  They felt that the patient probably had either [facility]  spotted fever or possible Ehrlichia.  The patient was placed on  doxycycline with nice clinical response.  At the time of discharge, he  had been afebrile for number of days, felt well, was ambulatory with  assistance of a walker, and his appetite was back to baseline.  The  patient was treated with IV fluids for dehydration and received  potassium supplementation.  Laboratory studies revealed thrombocytopenia  and elevated liver function studies.  The patient also had neutropenia.  In the hospital, he was treated with DVT prophylaxis when his platelet  count improved.  Chest x-ray as well as CT scan of the abdomen and  pelvis was unremarkable except for moderate bilateral pleural effusions.  Blood cultures were stable.   DISPOSITION:  The patient was discharged today to complete an additional  seven days  of doxycycline.  He is now asked to follow up with his  primary care Hosteen Kienast in five days.  He will be discharged on medical  regimen as listed above, on a no-added salt, heart healthy diet.   CONDITION ON DISCHARGE:  Stable.      Gordy Savers, MD  Electronically Signed     PFK/MEDQ  D:  05/28/2008  T:  05/28/2008  Job:  [id number]

## 2011-03-11 NOTE — Assessment & Plan Note (Signed)
Stable creatinine

## 2011-03-11 NOTE — Assessment & Plan Note (Addendum)
Worsened control since last check. Goal <70 given past CVA.  Has been eating more fatty foods than usual. Will get back on track with diet...recheck in 6 months.

## 2011-03-11 NOTE — Progress Notes (Signed)
  Subjective:    Patient ID: Mike Walton, male    DOB: July 17, 1920, 77 y.o.   MRN: [id number]  HPI I have personally reviewed the Medicare Annual Wellness questionnaire and have noted 1. The patient's medical and social history 2. Their use of alcohol, tobacco or illicit drugs 3. Their current medications and supplements 4. The patient's functional ability including ADL's, fall risks, home safety risks and hearing or visual             impairment. 5. Diet and physical activities 6. Evidence for depression or mood disorders The patients weight, height, BMI and visual acuity have been recorded in the chart I have made referrals, counseling and provided education to the patient based review of the above and I have provided the pt with a written personalized care plan for preventive services.  Doing well overall. Has had multiple tick bites in garden in past few weeks, but no rash, no HA, no fever, no joint pain.    Review of Systems  Constitutional: Negative for fever and fatigue.  HENT: Negative for ear pain.   Eyes: Negative for pain.  Respiratory: Negative for shortness of breath.   Cardiovascular: Negative for chest pain, palpitations and leg swelling.  Gastrointestinal: Negative for abdominal pain, diarrhea, constipation and blood in stool.  Genitourinary: Negative for dysuria, frequency, discharge, penile pain and testicular pain.  Skin: Negative for rash.  Psychiatric/Behavioral: Negative for suicidal ideas, behavioral problems, dysphoric mood and agitation.       Objective:   Physical Exam  Constitutional: He appears well-developed and well-nourished.  Non-toxic appearance. He does not appear ill. No distress.       Elderly male..using no support device  HENT:  Head: Normocephalic and atraumatic.  Right Ear: Hearing, tympanic membrane, external ear and ear canal normal.  Left Ear: Hearing, tympanic membrane, external ear and ear canal normal.  Nose: Nose normal.    Mouth/Throat: Uvula is midline, oropharynx is clear and moist and mucous membranes are normal.  Eyes: Conjunctivae, EOM and lids are normal. Pupils are equal, round, and reactive to light. No foreign bodies found.  Neck: Trachea normal, normal range of motion and phonation normal. Neck supple. Carotid bruit is not present. No mass and no thyromegaly present.  Cardiovascular: Normal rate, regular rhythm, S1 normal, S2 normal, intact distal pulses and normal pulses.  Exam reveals no gallop.   No murmur heard. Pulmonary/Chest: Breath sounds normal. He has no wheezes. He has no rhonchi. He has no rales.  Abdominal: Soft. Normal appearance and bowel sounds are normal. There is no hepatosplenomegaly. There is no tenderness. There is no rebound, no guarding and no CVA tenderness. No hernia.  Lymphadenopathy:    He has no cervical adenopathy.  Neurological: He is alert. He has normal strength and normal reflexes. No cranial nerve deficit or sensory deficit. Gait normal.  Skin: Skin is warm, dry and intact. No rash noted.  Psychiatric: He has a normal mood and affect. His speech is normal and behavior is normal. Judgment normal.          Assessment & Plan:  Annual Medicare Wellness: The patient's preventative maintenance and recommended screening tests for an annual wellness exam were reviewed in full today. Brought up to date unless services declined.  Counselled on the importance of diet, exercise, and its role in overall health and mortality. The patient's FH and SH was reviewed, including their home life, tobacco status, and drug and alcohol status.

## 2011-03-11 NOTE — Patient Instructions (Addendum)
Get back on track with lower fat lower cholesterol foods.  Avoid animal fats like butter and lard and instead use olive oil or canola oil. We will check again next year. Look into shingles vaccine coverage. Call us if interested. Look into  setting up health care power of attorney and living will.

## 2011-03-11 NOTE — Assessment & Plan Note (Signed)
Resolved with diet changes. 

## 2011-03-11 NOTE — H&P (Signed)
NAME:  Mike Walton, Mike Walton               ACCOUNT NO.:  [id number]   MEDICAL RECORD NO.:  [id number]          PATIENT TYPE:  INP   LOCATION:  5152                         FACILITY:  [facility]   PHYSICIAN:  Georgina Quint. Plotnikov, MDDATE OF BIRTH:  1920-04-16   DATE OF ADMISSION:  05/21/2008  DATE OF DISCHARGE:                              HISTORY & PHYSICAL   CHIEF COMPLAINT:  Fever.   HISTORY OF PRESENT ILLNESS:  The patient is an 77 year old male, who is  a very poor historian, comes in with apparently 3 days history of  febrile illness, apparently, he was seen by a physician prior.  There  was no sore throat, cough, or urinary symptoms as such.  The history was  obtained from Dr. Denton Lank.   PRIMARY CARE DOCTOR:  Someone at [facility], [employer].   PAST MEDICAL HISTORY:  1. Hypertension.  2. Elevated cholesterol.   CURRENT MEDICATIONS:  Unknown.   ALLERGIES:  None.   SOCIAL HISTORY:  He states he is married, lives with wife.   FAMILY HISTORY:  Positive for hypertension.   REVIEW OF SYSTEMS:  Unobtainable from the patient, ER chart reviewed.   PHYSICAL EXAMINATION:  VITAL SIGNS:  Temperature 103.6, later 100.6;  blood pressure 116/63, heart rate 98, respirations 18, and sats 95% on  room air.  GENERAL:  He is in no acute distress, alert, cooperative, and  disoriented.  HEENT:  Dryish oral mucosa.  NECK:  Supple.  No meningeal signs.  LUNGS:  Clear with decreased breath sounds at bases.  HEART:  With S1 and S2, slight tachycardia.  ABDOMEN:  Soft, nontender, and nondistended.  No organomegaly.  No  masses felt.  EXTREMITIES:  Lower extremities are without edema.  Calves nontender.  SKIN:  Without rashes.  RECTAL:  Per [facility] physician.  NEUROLOGIC:  Cranial nerves II through XII nonfocal.  Deep tendon  reflexes and muscle strength grossly within normal limits.   LABORATORY DATA:  Sodium 137, potassium 3.4, BUN 34, glucose 104,  creatinine 2.2, white count 3.4,  hemoglobin 13.6, platelets 93,000,  neutrophils 87%, and INR 1.2.  Urinalysis with 0-2 wbc's and 7-10 rbc's.  Chest x-ray without acute changes.   ASSESSMENT AND PLAN:  1. Febrile illness, possible pyelonephritis, and possible early      urosepsis.  We will start on IV antibiotics.  Obtain abdominal      ultrasound.  2. Hypertension.  3. Confusion, possibly acute on chronic.  He will use lorazepam p.r.n.  4. Hypokalemia.  We will replace IV at present.  5. Dehydration.  We will treat with IV fluids.  6. Thrombocytopenia.  We will not use Lovenox at present.  7. Deep vein thrombosis prophylaxis.      Georgina Quint. Plotnikov, MD  Electronically Signed     AVP/MEDQ  D:  05/21/2008  T:  05/21/2008  Job:  [id number]   cc:   Justice [facility]

## 2011-03-14 NOTE — Assessment & Plan Note (Signed)
[facility]                           [facility] OFFICE NOTE   Mike, Walton                      MRN:          [id number]  DATE:10/13/2006                            DOB:          [birth year]    NEW PATIENT H AND P   CHIEF COMPLAINT:  An 77 year old black man here to establish new  physician.   HISTORY OF PRESENT ILLNESS:  Mike Walton is an 77 year old gentleman with  no current concerns who is setting up care here secondary to change of  insurance, coverage, and proximity.  He has the following chronic  issues:  1. High blood pressure, stable:  He states his blood pressure is      usually under very good control on lisinopril/hydrochlorothiazide      20/12.5 mg daily.  He states he does not have any side effects from      this medication.  He states his blood pressure is elevated today      because he did not take his blood pressure medicine yet.  He states      he did not do this because he was not sure if I wanted him to or if      he needed to not do it because of any test.  He also takes 81 mg of      aspirin daily.  2. Hypercholesterolemia, stable:  He states that this was checked      fairly recently and is well controlled on Lipitor 10 mg daily.   REVIEW OF SYSTEMS:  No headache.  No dizziness.  No syncope.  He wears  reading glasses.  He has dentures but does not wear them to eat and only  wears them in public places.  He does have some problems with hearing in  crowded spaces but does not desire a hearing aid.  He denies dyspnea,  chest pain, palpitations.  He states that he does not have a great  appetite but denies depression or anxiety.  He states that he mainly  eats one meal a day because he does not like to take time to eat.  He  does not like food all that much and never has.  He states that his  weight has been stable.  He denies any nausea, vomiting, diarrhea, or  constipation, or rectal bleeding.  MUSCULOSKELETAL:   Chronic neck  stiffness ever since a swimming pool injury many years ago.   PAST MEDICAL HISTORY:  1. Hypertension.  2. Hypercholesterolemia.   HOSPITALIZATION, SURGERIES, PROCEDURES:  None.   ALLERGIES:  No known drug allergies.   MEDICATIONS:  1. Lisinopril/hydrochlorothiazide 20/12.5 mg daily.  2. Aspirin 81 mg daily.  3. Lipitor 10 mg daily.  4. Multivitamin daily.  5. Fish oil daily.   FAMILY HISTORY:  Father deceased at age 35, healthy but may have had a  heart attack.  Mother died at age 52 with pellagra.  He has 1 brother  who he is unsure of what he passed away from.  He has 3 sisters mainly  who died secondary to old age.  There is no family history of any type  of cancer or diabetes.   SOCIAL HISTORY:  He does not smoke, drink, or use alcohol.  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  He has been  married for 53 years happily.  He has 3 children who are basically  healthy.  He gets most of his exercise by gardening and doing yard work.  He states he mows the lawn and lifts wood chips without difficulty.  He  is a fair eater but only eats 1 meal a day.  He does try to eat fruits  and vegetables but does not drink much water.   VITAL SIGNS:  Height 67 inches.  Weight 147, making BMI 23.  Blood  pressure 150/90.  Pulse 88.  Temperature 97.9.  GENERAL:  Elderly appearing male in no apparent distress.  HEENT:  Arcus senilis.  PERRLA.  Extraocular muscles intact.  Nares  clear.  Tympanic membranes with copious cerumen but not impacted,  wearing dentures.  Oropharynx clear.  No thyromegaly.  No  lymphadenopathy.  CARDIOVASCULAR:  Regular rate and rhythm.  No murmurs, rubs, or gallops.  PULMONARY:  Clear to auscultation bilaterally.  No wheezes, rales, or  rhonchi.  ABDOMEN:  Firm, difficult to palpate, although the patient thin,  possible central and right upper quadrant mass versus the patient  holding abdominal muscles tightly without  being able to relax.  MUSCULOSKELETAL:  Strength 5/5 in the upper and lower extremities.  Decreased neck extension at about 2 degrees secondary to old neck  injury, normal neck flexion, and decreased side-bending.  NEURO:  Cranial nerves II through XII grossly intact.  Alert and  oriented x3.  Gait within normal limits.   ASSESSMENT AND PLAN:  1. Hypertension, well-controlled.  Instructed patient to take his      medication daily.  He will continue on lisinopril.  2. Hypercholesterolemia, stable:  Unsure when this was last evaluated.      I will get records from his previous doctor.  He will continue on      Lipitor 10 mg daily.  3. Abdominal bloating and gas and decreased appetite:  Possible      abdominal mass.  In discussion of colon cancer screening, the      patient did state that he does not want any extreme workup because      he is afraid that we would find something suggesting colon cancer.      I will review his old records to determine if it has been noted      before that he has some abdominal distention or mass as well as      will reexamine the patient.  We can also consider doing Hemoccult      cards.  He denies any stool change.  4. Prevention.  It is unnecessary to do PSA secondary to age.  The      patient states that he refuses any colon cancer screening.  He is      up to date with Pneumovax as well as influenza vaccine.  I will      obtain his whole records to determine if there is anything else      prevention-wise that we need to get up to date with.    Mike Nora, MD  Electronically Signed   AB/MedQ  DD: 10/13/2006  DT: 10/13/2006  Job #: [id number]

## 2011-05-31 ENCOUNTER — Other Ambulatory Visit: Payer: Self-pay | Admitting: Family Medicine

## 2011-07-25 LAB — COMPREHENSIVE METABOLIC PANEL
ALT: 64 — ABNORMAL HIGH
ALT: 65 — ABNORMAL HIGH
AST: 168 — ABNORMAL HIGH
AST: 210 — ABNORMAL HIGH
Albumin: 3.1 — ABNORMAL LOW
Alkaline Phosphatase: 37 — ABNORMAL LOW
Alkaline Phosphatase: 40
CO2: 22
CO2: 25
Calcium: 7.7 — ABNORMAL LOW
Chloride: 103
Chloride: 104
Creatinine, Ser: 1.8 — ABNORMAL HIGH
GFR calc Af Amer: 44 — ABNORMAL LOW
GFR calc Af Amer: 55 — ABNORMAL LOW
GFR calc non Af Amer: 36 — ABNORMAL LOW
GFR calc non Af Amer: 46 — ABNORMAL LOW
Potassium: 3.6
Potassium: 3.9
Sodium: 137
Total Bilirubin: 0.8

## 2011-07-25 LAB — CBC
HCT: 40
HCT: 43.6
Hemoglobin: 13.1
MCHC: 32.8
MCHC: 33.5
MCHC: 34.6
MCHC: 33.7
MCV: 90.9
MCV: 92.7
MCV: 93.2
Platelets: 55 — ABNORMAL LOW
Platelets: 65 — ABNORMAL LOW
RBC: 4.39
RBC: 4.82
RBC: 3.89 — ABNORMAL LOW
RDW: 13.3
RDW: 13.4
RDW: 13.8
WBC: 3.4 — ABNORMAL LOW
WBC: 3.8 — ABNORMAL LOW
WBC: 9.3

## 2011-07-25 LAB — HEPATIC FUNCTION PANEL
ALT: 173 — ABNORMAL HIGH
Albumin: 2.2 — ABNORMAL LOW
Alkaline Phosphatase: 53
Bilirubin, Direct: 0.2
Bilirubin, Direct: 0.2
Indirect Bilirubin: 0.4
Total Bilirubin: 1
Total Protein: 5.4 — ABNORMAL LOW
Total Protein: 5 — ABNORMAL LOW

## 2011-07-25 LAB — BASIC METABOLIC PANEL
BUN: 21
BUN: 24 — ABNORMAL HIGH
CO2: 25
Calcium: 7.5 — ABNORMAL LOW
Chloride: 103
Chloride: 104
Creatinine, Ser: 1.45
GFR calc non Af Amer: 43 — ABNORMAL LOW
Glucose, Bld: 104 — ABNORMAL HIGH
Potassium: 3.9

## 2011-07-25 LAB — LIPASE, BLOOD: Lipase: 47

## 2011-07-25 LAB — URINE CULTURE: Culture: NO GROWTH

## 2011-07-25 LAB — POCT I-STAT, CHEM 8
BUN: 34 — ABNORMAL HIGH
Calcium, Ion: 0.97 — ABNORMAL LOW
Chloride: 104
Glucose, Bld: 104 — ABNORMAL HIGH
TCO2: 21

## 2011-07-25 LAB — URINALYSIS, ROUTINE W REFLEX MICROSCOPIC
Bilirubin Urine: NEGATIVE
Ketones, ur: 15 — AB
Nitrite: NEGATIVE
Specific Gravity, Urine: 1.015
Urobilinogen, UA: 0.2

## 2011-07-25 LAB — CULTURE, BLOOD (ROUTINE X 2)
Culture: NO GROWTH
Culture: NO GROWTH

## 2011-07-25 LAB — DIFFERENTIAL
Basophils Absolute: 0
Basophils Relative: 0
Eosinophils Absolute: 0
Eosinophils Relative: 0
Monocytes Absolute: 0.1

## 2011-07-25 LAB — PROTIME-INR: Prothrombin Time: 15.1

## 2011-07-25 LAB — URINE MICROSCOPIC-ADD ON

## 2011-07-25 LAB — ROCKY MTN SPOTTED FVR AB, IGM-BLOOD: RMSF IgM: 0.11 IV

## 2011-07-25 LAB — AMYLASE: Amylase: 141 — ABNORMAL HIGH

## 2011-07-25 LAB — ROCKY MTN SPOTTED FVR AB, IGG-BLOOD: RMSF IgG: 0.24 IV

## 2011-07-25 LAB — HEPATITIS PANEL, ACUTE
Hep B C IgM: NEGATIVE
Hepatitis B Surface Ag: NEGATIVE

## 2011-09-01 ENCOUNTER — Ambulatory Visit (INDEPENDENT_AMBULATORY_CARE_PROVIDER_SITE_OTHER): Payer: Medicare Other

## 2011-09-01 DIAGNOSIS — Z23 Encounter for immunization: Secondary | ICD-10-CM

## 2011-09-17 ENCOUNTER — Other Ambulatory Visit (INDEPENDENT_AMBULATORY_CARE_PROVIDER_SITE_OTHER): Payer: Medicare Other

## 2011-09-17 DIAGNOSIS — E785 Hyperlipidemia, unspecified: Secondary | ICD-10-CM

## 2011-09-17 LAB — LIPID PANEL
Cholesterol: 128 mg/dL (ref 0–200)
HDL: 47.4 mg/dL (ref >39.00)
LDL Cholesterol: 63 mg/dL (ref 0–99)
VLDL: 17.4 mg/dL (ref 0.0–40.0)

## 2011-09-19 ENCOUNTER — Other Ambulatory Visit: Payer: Self-pay | Admitting: Family Medicine

## 2011-09-22 ENCOUNTER — Ambulatory Visit (INDEPENDENT_AMBULATORY_CARE_PROVIDER_SITE_OTHER): Payer: Medicare Other | Admitting: Family Medicine

## 2011-09-22 ENCOUNTER — Encounter: Payer: Self-pay | Admitting: Family Medicine

## 2011-09-22 DIAGNOSIS — I10 Essential (primary) hypertension: Secondary | ICD-10-CM

## 2011-09-22 DIAGNOSIS — R7309 Other abnormal glucose: Secondary | ICD-10-CM

## 2011-09-22 DIAGNOSIS — E785 Hyperlipidemia, unspecified: Secondary | ICD-10-CM

## 2011-09-22 NOTE — Assessment & Plan Note (Signed)
Resolved

## 2011-09-22 NOTE — Patient Instructions (Signed)
Continue healthy eating, exercise as able.  Call if any issues begin.  Follow up for Medicare subsequent visit in 6 months with CPX labs prior.

## 2011-09-22 NOTE — Assessment & Plan Note (Signed)
Well controlled. Continue current medication.  

## 2011-09-22 NOTE — Assessment & Plan Note (Signed)
Re-eval.. [prior to next appt. 

## 2011-09-22 NOTE — Progress Notes (Signed)
  Subjective:    Patient ID: Mike Walton, male    DOB: [birth year], 77 y.o.   MRN: [id number]  HPI  77 year old male presents for 6 month follow up. He reports he is feeling great overall.   Hypertension:  Well controlled on lisinopril/HCTZ and coreg.   Using medication without problems or lightheadedness:  None Chest pain with exertion:None Edema:None Short of breath:None Average home BPs: well controlled checking every few days at home. Other issues: He feels that his  Vision was worse after cataract surgery... But he does not  Want to have any one "mess with his eyes more" No headaches.  Elevated Cholesterol: Great control (LDL in 60s) on zocor.. Pt without SE and wishes to continue maximal treatment given history of TIA. Using medications without problems:None Muscle aches: None Diet compliance:Good, craving more sweets lately. No weight loss.  Exercise:Good Other complaints: Hx of TIA   No recent falls, walks slowly.   CKD stage 3: stable last check in 02/2011, recheck prior to next appt.      Review of Systems  Constitutional: Negative for fever and fatigue.  HENT: Negative for ear pain.   Eyes: Negative for pain.  Respiratory: Negative for cough.   Cardiovascular: Negative for chest pain, palpitations and leg swelling.  Gastrointestinal: Negative for abdominal distention.  Musculoskeletal:       Neck stiffness, no pain, no radicular symptoms.       Objective:   Physical Exam  Constitutional: Vital signs are normal. He appears well-developed and well-nourished.       Elderly male in NAD.  HENT:  Head: Normocephalic.  Right Ear: Hearing normal.  Left Ear: Hearing normal.  Nose: Nose normal.  Mouth/Throat: Oropharynx is clear and moist and mucous membranes are normal.  Neck: Trachea normal. Carotid bruit is not present. No mass and no thyromegaly present.  Cardiovascular: Normal rate, regular rhythm and normal pulses.  Exam reveals no gallop, no distant heart  sounds and no friction rub.   No murmur heard.      No peripheral edema  Pulmonary/Chest: Effort normal and breath sounds normal. No respiratory distress.  Skin: Skin is warm, dry and intact. No rash noted.  Psychiatric: He has a normal mood and affect. His speech is normal and behavior is normal. Thought content normal.          Assessment & Plan:

## 2011-12-08 ENCOUNTER — Ambulatory Visit (INDEPENDENT_AMBULATORY_CARE_PROVIDER_SITE_OTHER): Payer: MEDICARE | Admitting: Family Medicine

## 2011-12-08 ENCOUNTER — Encounter: Payer: Self-pay | Admitting: Family Medicine

## 2011-12-08 DIAGNOSIS — H6120 Impacted cerumen, unspecified ear: Secondary | ICD-10-CM

## 2011-12-08 DIAGNOSIS — I10 Essential (primary) hypertension: Secondary | ICD-10-CM

## 2011-12-08 NOTE — Patient Instructions (Signed)
Keep taking your meds as directed.   Take care.

## 2011-12-08 NOTE — Progress Notes (Signed)
"  I took a shower and then I couldn't hear."  Happened 3 days ago.  No pain.  No FCNAVD.  Minimal rhinorrhea a few days ago.    Recheck BP 140/80 x2.   Meds, vitals, and allergies reviewed.   ROS: See HPI.  Otherwise, noncontributory.  nad ncat Pinna wnl x2, B cerumen impaction, resolved with irrigation, felt better, hearing improved, TM wnl Nasal and OP w/o acute changes

## 2011-12-10 DIAGNOSIS — H6120 Impacted cerumen, unspecified ear: Secondary | ICD-10-CM | POA: Insufficient documentation

## 2011-12-10 NOTE — Assessment & Plan Note (Signed)
Treated and tolerated well.

## 2011-12-10 NOTE — Assessment & Plan Note (Signed)
Reviewed BP meds with patient. Continue as is.

## 2011-12-15 ENCOUNTER — Other Ambulatory Visit: Payer: Self-pay | Admitting: Family Medicine

## 2012-03-18 ENCOUNTER — Other Ambulatory Visit: Payer: Medicare Other

## 2012-03-25 ENCOUNTER — Ambulatory Visit: Payer: Medicare Other | Admitting: Family Medicine

## 2012-03-25 ENCOUNTER — Encounter: Payer: Medicare Other | Admitting: Family Medicine

## 2012-03-30 ENCOUNTER — Telehealth: Payer: Self-pay | Admitting: Family Medicine

## 2012-03-30 DIAGNOSIS — R7309 Other abnormal glucose: Secondary | ICD-10-CM

## 2012-03-30 DIAGNOSIS — I10 Essential (primary) hypertension: Secondary | ICD-10-CM

## 2012-03-30 DIAGNOSIS — E785 Hyperlipidemia, unspecified: Secondary | ICD-10-CM

## 2012-03-30 NOTE — Telephone Encounter (Signed)
Message copied by Excell Seltzer on Tue Mar 30, 2012 11:02 AM ------      Message from: Alvina Chou      Created: Fri Mar 26, 2012  9:34 AM      Regarding: Lab orders for Thursday, 6-6       Patient is scheduled for CPX labs, please order future labs, Thanks , Camelia Eng

## 2012-04-01 ENCOUNTER — Other Ambulatory Visit: Payer: Self-pay

## 2012-04-08 ENCOUNTER — Encounter: Payer: Self-pay | Admitting: Family Medicine

## 2012-05-21 ENCOUNTER — Encounter: Payer: Self-pay | Admitting: Family Medicine

## 2012-05-21 ENCOUNTER — Ambulatory Visit (INDEPENDENT_AMBULATORY_CARE_PROVIDER_SITE_OTHER): Payer: Self-pay | Admitting: Family Medicine

## 2012-05-21 VITALS — BP 140/80 | HR 65 | Temp 98.9°F | Ht 66.0 in | Wt 130.0 lb

## 2012-05-21 DIAGNOSIS — E785 Hyperlipidemia, unspecified: Secondary | ICD-10-CM

## 2012-05-21 DIAGNOSIS — Z0000 Encounter for general adult medical examination without abnormal findings: Secondary | ICD-10-CM

## 2012-05-21 DIAGNOSIS — I10 Essential (primary) hypertension: Secondary | ICD-10-CM

## 2012-05-21 DIAGNOSIS — R7309 Other abnormal glucose: Secondary | ICD-10-CM

## 2012-05-21 NOTE — Assessment & Plan Note (Signed)
Due for re-eval. 

## 2012-05-21 NOTE — Patient Instructions (Addendum)
Return for fasting lab evaluation.  Look into shingles vaccine coverage.  Eat three meals a day, with snack in between. In addition to meals, add Boost or Ensure.

## 2012-05-21 NOTE — Assessment & Plan Note (Signed)
Well controlled. Continue current medication.  

## 2012-05-21 NOTE — Progress Notes (Signed)
HPI  I have personally reviewed the Medicare Annual Wellness questionnaire and have noted  1. The patient's medical and social history  2. Their use of alcohol, tobacco or illicit drugs  3. Their current medications and supplements  4. The patient's functional ability including ADL's, fall risks, home safety risks and hearing or visual  impairment.  5. Diet and physical activities  6. Evidence for depression or mood disorders  The patients weight, height, BMI and visual acuity have been recorded in the chart  I have made referrals, counseling and provided education to the patient based review of the above and I have provided the pt with a written personalized care plan for preventive services.   Doing well overall.   Has had multiple tick bites in garden in past few weeks, but no rash, no HA, no fever, no joint pain.    Has not had labs performed yet this year to eval prediabetes an high cholesterol.  Lost 8 lbs in last year.  Review of Systems  Constitutional: Negative for fever and fatigue.  HENT: Negative for ear pain.  Eyes: Negative for pain.  Respiratory: Negative for shortness of breath.  Cardiovascular: Negative for chest pain, palpitations and leg swelling.  Gastrointestinal: Negative for abdominal pain, diarrhea, constipation and blood in stool.  Genitourinary: Negative for dysuria, frequency, discharge, penile pain and testicular pain.  Skin: Negative for rash.  Psychiatric/Behavioral: Negative for suicidal ideas, behavioral problems, dysphoric mood and agitation.    Objective:   Physical Exam  Constitutional: He appears well-developed and well-nourished. Non-toxic appearance. He does not appear ill. No distress.  Elderly male..using no support device  HENT:  Head: Normocephalic and atraumatic.  Right Ear: Hearing, tympanic membrane, external ear and ear canal normal.  Left Ear: Hearing, tympanic membrane, external ear and ear canal normal.  Nose: Nose normal.    Mouth/Throat: Uvula is midline, oropharynx is clear and moist and mucous membranes are normal.  Eyes: Conjunctivae, EOM and lids are normal. Pupils are equal, round, and reactive to light. No foreign bodies found.  Neck: Trachea normal, normal range of motion and phonation normal. Neck supple. Carotid bruit is not present. No mass and no thyromegaly present.  Cardiovascular: Normal rate, regular rhythm, S1 normal, S2 normal, intact distal pulses and normal pulses. Exam reveals no gallop.  No murmur heard.  Pulmonary/Chest: Breath sounds normal. He has no wheezes. He has no rhonchi. He has no rales.  Mobile nodule in left lower rib cage.. Evaluated with X-ray years ago per pt, normal, Abdominal: Soft. Normal appearance and bowel sounds are normal. There is no hepatosplenomegaly. There is no tenderness. There is no rebound, no guarding and no CVA tenderness. No hernia.  Lymphadenopathy:  He has no cervical adenopathy.  Neurological: He is alert. He has normal strength and normal reflexes. No cranial nerve deficit or sensory deficit. Gait normal.  Skin: Skin is warm, dry and intact. No rash noted.  Psychiatric: He has a normal mood and affect. His speech is normal and behavior is normal. Judgment normal.    Assessment & Plan:   Annual Medicare Wellness: The patient's preventative maintenance and recommended screening tests for an annual wellness exam were reviewed in full today.  Brought up to date unless services declined.  Counselled on the importance of diet, exercise, and its role in overall health and mortality.  The patient's FH and SH was reviewed, including their home life, tobacco status, and drug and alcohol status.

## 2012-06-29 ENCOUNTER — Other Ambulatory Visit: Payer: Self-pay | Admitting: Family Medicine

## 2012-07-14 ENCOUNTER — Ambulatory Visit (INDEPENDENT_AMBULATORY_CARE_PROVIDER_SITE_OTHER): Payer: Self-pay

## 2012-07-14 DIAGNOSIS — Z23 Encounter for immunization: Secondary | ICD-10-CM

## 2012-10-08 ENCOUNTER — Other Ambulatory Visit: Payer: Self-pay | Admitting: Family Medicine

## 2012-11-16 ENCOUNTER — Other Ambulatory Visit (INDEPENDENT_AMBULATORY_CARE_PROVIDER_SITE_OTHER): Payer: Medicare Other

## 2012-11-16 DIAGNOSIS — I10 Essential (primary) hypertension: Secondary | ICD-10-CM

## 2012-11-16 DIAGNOSIS — R7309 Other abnormal glucose: Secondary | ICD-10-CM

## 2012-11-16 DIAGNOSIS — E785 Hyperlipidemia, unspecified: Secondary | ICD-10-CM

## 2012-11-16 LAB — CBC WITH DIFFERENTIAL/PLATELET
Basophils Absolute: 0 10*3/uL (ref 0.0–0.1)
Eosinophils Absolute: 0.2 10*3/uL (ref 0.0–0.7)
Lymphocytes Relative: 34.2 % (ref 12.0–46.0)
MCHC: 33 g/dL (ref 30.0–36.0)
Monocytes Relative: 11.8 % (ref 3.0–12.0)
Neutrophils Relative %: 50.5 % (ref 43.0–77.0)
Platelets: 215 10*3/uL (ref 150.0–400.0)
RDW: 13.8 % (ref 11.5–14.6)

## 2012-11-16 LAB — COMPREHENSIVE METABOLIC PANEL
ALT: 16 U/L (ref 0–53)
Albumin: 3.7 g/dL (ref 3.5–5.2)
CO2: 31 mEq/L (ref 19–32)
Calcium: 9.1 mg/dL (ref 8.4–10.5)
Chloride: 102 mEq/L (ref 96–112)
Creatinine, Ser: 1.6 mg/dL — ABNORMAL HIGH (ref 0.4–1.5)
GFR: 54.4 mL/min — ABNORMAL LOW (ref >60.00)
Potassium: 3.7 mEq/L (ref 3.5–5.1)
Total Protein: 6.9 g/dL (ref 6.0–8.3)

## 2012-11-16 LAB — LIPID PANEL
Total CHOL/HDL Ratio: 3
Triglycerides: 89 mg/dL (ref 0.0–149.0)

## 2012-11-23 ENCOUNTER — Ambulatory Visit: Payer: Self-pay | Admitting: Family Medicine

## 2012-11-25 ENCOUNTER — Encounter: Payer: Self-pay | Admitting: Family Medicine

## 2012-11-25 ENCOUNTER — Ambulatory Visit (INDEPENDENT_AMBULATORY_CARE_PROVIDER_SITE_OTHER): Payer: Medicare Other | Admitting: Family Medicine

## 2012-11-25 VITALS — BP 130/84 | HR 68 | Temp 97.4°F | Ht 66.0 in | Wt 140.5 lb

## 2012-11-25 DIAGNOSIS — R7309 Other abnormal glucose: Secondary | ICD-10-CM

## 2012-11-25 DIAGNOSIS — I10 Essential (primary) hypertension: Secondary | ICD-10-CM

## 2012-11-25 DIAGNOSIS — E785 Hyperlipidemia, unspecified: Secondary | ICD-10-CM

## 2012-11-25 NOTE — Patient Instructions (Addendum)
You can stop simvastatin  (zocor) the cholesterol medicaiton if you would like given limited benefit at this age. Let me know if numbness in hands worsening, any weakness in grip strength, pain in hands or pain in neck... If so we can move forward with treatment.

## 2012-11-25 NOTE — Progress Notes (Signed)
  Subjective:    Patient ID: Mike Walton, male    DOB: 08/25/1920, 79 y.o.   MRN: [id number]  HPI Hypertension:  At goal on coreg and lisinopril HCTZ Using medication without problems or lightheadedness: None Chest pain with exertion:None Edema:None Short of breath:None Average home BPs:not checking Other issues:  Elevated Cholesterol: LDL almost at goal <70 on simvastatin. Discussed stopping statin due to age. Pt is asymptomatic from SE. He would like to stop this. 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 Other complaints: HX of CVA  Prediabetes:  Borderline.  Renal insufficiency: At baseline 1.5-1.8   Review of Systems Has noted easy bleeding. Having intermittent numbness in hands, in fingertips in last few years. Some neck pain, has arthritis in neck. No grip strength change.    Objective:   Physical Exam  Constitutional: Vital signs are normal. He appears well-developed and well-nourished.       Elderly male in NAD.  HENT:  Head: Normocephalic.  Right Ear: Hearing normal.  Left Ear: Hearing normal.  Nose: Nose normal.  Mouth/Throat: Oropharynx is clear and moist and mucous membranes are normal.  Neck: Trachea normal. Carotid bruit is not present. No mass and no thyromegaly present.  Cardiovascular: Normal rate, regular rhythm and normal pulses.  Exam reveals no gallop, no distant heart sounds and no friction rub.   No murmur heard.      No peripheral edema  Pulmonary/Chest: Effort normal and breath sounds normal. No respiratory distress.  Skin: Skin is warm, dry and intact. No rash noted.  Psychiatric: He has a normal mood and affect. His speech is normal and behavior is normal. Thought  content normal.          Assessment & Plan:

## 2012-11-25 NOTE — Assessment & Plan Note (Signed)
Stable

## 2012-11-25 NOTE — Assessment & Plan Note (Signed)
Will stop due to age. Pt agreeable.

## 2012-11-25 NOTE — Assessment & Plan Note (Signed)
Well controlled. Continue current medication.  

## 2013-03-23 ENCOUNTER — Other Ambulatory Visit: Payer: Self-pay | Admitting: Family Medicine

## 2013-05-05 ENCOUNTER — Encounter: Payer: Self-pay | Admitting: Family Medicine

## 2013-05-05 ENCOUNTER — Ambulatory Visit (INDEPENDENT_AMBULATORY_CARE_PROVIDER_SITE_OTHER): Payer: Self-pay | Admitting: Family Medicine

## 2013-05-05 VITALS — BP 160/80 | HR 78 | Temp 98.1°F | Ht 66.0 in | Wt 138.8 lb

## 2013-05-05 DIAGNOSIS — H9190 Unspecified hearing loss, unspecified ear: Secondary | ICD-10-CM

## 2013-05-05 DIAGNOSIS — H9193 Unspecified hearing loss, bilateral: Secondary | ICD-10-CM

## 2013-05-05 DIAGNOSIS — F0390 Unspecified dementia without behavioral disturbance: Secondary | ICD-10-CM | POA: Insufficient documentation

## 2013-05-05 DIAGNOSIS — F0670 Mild neurocognitive disorder due to known physiological condition without behavioral disturbance: Secondary | ICD-10-CM

## 2013-05-05 DIAGNOSIS — F0280 Dementia in other diseases classified elsewhere without behavioral disturbance: Secondary | ICD-10-CM

## 2013-05-05 DIAGNOSIS — G309 Alzheimer's disease, unspecified: Secondary | ICD-10-CM

## 2013-05-05 HISTORY — DX: Alzheimer's disease, unspecified: G30.9

## 2013-05-05 HISTORY — DX: Mild neurocognitive disorder due to known physiological condition without behavioral disturbance: F06.70

## 2013-05-05 MED ORDER — DONEPEZIL HCL 5 MG PO TABS: 5.0000 mg | ORAL_TABLET | Freq: Every evening | ORAL | Status: DC | PRN

## 2013-05-05 NOTE — Progress Notes (Signed)
Nature conservation officer at [facility] [address] [place] [address] Phone: [phone] Fax: [phone]  Date:  05/05/2013   Name:  Mike Walton   DOB:  10/06/1920   MRN:  [id number] Gender: male Age: 79 y.o.  Primary Physician:  Kerby Nora, MD  Evaluating MD: Hannah Beat, MD   Chief Complaint: Cerumen Impaction   History of Present Illness:  Mike Walton is a 79 y.o. pleasant patient who presents with the following:  Ears clogged.  Dementia question raised by daughter:  The patient presents for 2 issues. Primarily, initially they came in for some decreased hearing, but also the patient and his wife raised the issue of whether or not his memory has become impaired and if he has dementia. He has been forgetting sometimes where he is going. 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 Both his wife and his daughter think that his memory has been going somewhat.  Patient Active Problem List   Diagnosis Date Noted  . CHRONIC KIDNEY DISEASE STAGE III (MODERATE) 12/09/2010  . PREDIABETES 06/16/2007  . HYPERLIPIDEMIA 06/14/2007  . HYPERTENSION 06/14/2007  . OSTEOARTHRITIS 06/14/2007  . TRANSIENT ISCHEMIC ATTACK, HX OF 06/14/2007    Past Medical History  Diagnosis Date  . Stroke   . Hypertension   . Hyperlipidemia   . Arthritis   . Chronic kidney disease     No past surgical history on file.  History   Social History  . Marital Status: Married    Spouse Name: N/A    Number of Children: 6  . Years of Education: N/A   Occupational History  . retired    Social History Main Topics  . Smoking status: Never Smoker   . Smokeless tobacco: Not on file  . Alcohol Use: No  . Drug Use: No  . Sexually Active: Not on file   Other Topics Concern  . Not on file   Social History Narrative   Regular exercise-- yes gardening, very active      Diet: fruits and veggies  Two meals a  day      Does not have living will or HCPOA.   Full Code. Reviewed 2013.             Family History  Problem Relation Age of Onset  . Heart attack Father     No Known Allergies  Medication list has been reviewed and updated.  Outpatient Prescriptions Prior to Visit  Medication Sig Dispense Refill  . aspirin 81 MG tablet Take 81 mg by mouth daily.       . carvedilol (COREG) 6.25 MG tablet TAKE ONE TABLET BY MOUTH TWICE DAILY  60 tablet  0  . lisinopril-hydrochlorothiazide (PRINZIDE,ZESTORETIC) 20-12.5 MG per tablet TAKE TWO TABLETS BY MOUTH DAILY  120 tablet  6  . Multiple Vitamin (MULTIVITAMIN) tablet Take 1 tablet by mouth daily.         No facility-administered medications prior to visit.    Review of Systems:  As above. No falls, trauma, headache, slurred speech, weakness. Otherwise feeling well.  Physical Examination: BP 160/80  Pulse 78  Temp(Src) 98.1 F (36.7 C) (Oral)  Ht 5\' 6"  (1.676 m)  Wt 138 lb 12 oz (62.937 kg)  BMI 22.41 kg/m2  SpO2 99%  Ideal Body Weight: Weight in (lb) to have BMI = 25: 154.6   GEN: WDWN, NAD, Non-toxic, A &  O x 3 HEENT: Atraumatic, Normocephalic. Neck supple. No masses, No LAD. Ears and Nose: No external deformity. CV: RRR, No M/G/R. No JVD. No thrill. No extra heart sounds. PULM: CTA B, no wheezes, crackles, rhonchi. No retractions. No resp. distress. No accessory muscle use. EXTR: No c/c/e NEURO Normal gait.  PSYCH: Normally interactive. Conversant. Not depressed or anxious appearing.  Calm demeanor.    Neuro: 24/30 Folstein  Assessment and Plan:  Mild neurocognitive disorder due to Alzheimer's disease  Decreased hearing of both ears  Nonacute event, slow onset dementia. Most consistent with Alzheimer's disease and presentation at 79 years old. Mild. Altered Mini-Mental Status exam at 24/30. Discussion with wife. Start Aricept 5 mg, and followup for full Medicare wellness exam with his primary care provider in the next  2 months.  Ceruminosis is noted.  Wax is removed by syringing and manual debridement. Instructions for home care to prevent wax buildup are given.   Orders Today:  No orders of the defined types were placed in this encounter.    Updated Medication List: (Includes new medications, updates to list, dose adjustments) Meds ordered this encounter  Medications  . donepezil (ARICEPT) 5 MG tablet    Sig: Take 1 tablet (5 mg total) by mouth at bedtime as needed.    Dispense:  30 tablet    Refill:  3    Medications Discontinued: There are no discontinued medications.    Signed, Elpidio Galea. Aubrey Blackard, MD 05/05/2013 10:01 AM

## 2013-05-05 NOTE — Patient Instructions (Signed)
F/u Dr. Ermalene Searing for Medicare wellness exam in 2 months

## 2013-06-14 ENCOUNTER — Other Ambulatory Visit: Payer: Self-pay | Admitting: Family Medicine

## 2013-08-04 ENCOUNTER — Ambulatory Visit (INDEPENDENT_AMBULATORY_CARE_PROVIDER_SITE_OTHER): Payer: Medicare Other

## 2013-08-04 DIAGNOSIS — Z23 Encounter for immunization: Secondary | ICD-10-CM

## 2013-10-17 ENCOUNTER — Other Ambulatory Visit: Payer: Self-pay | Admitting: Family Medicine

## 2014-01-24 ENCOUNTER — Encounter: Payer: Medicare Other | Admitting: Family Medicine

## 2014-02-02 ENCOUNTER — Ambulatory Visit (INDEPENDENT_AMBULATORY_CARE_PROVIDER_SITE_OTHER): Payer: Commercial Managed Care - HMO | Admitting: Family Medicine

## 2014-02-02 ENCOUNTER — Encounter: Payer: Self-pay | Admitting: Family Medicine

## 2014-02-02 ENCOUNTER — Telehealth: Payer: Self-pay | Admitting: Family Medicine

## 2014-02-02 VITALS — BP 130/86 | HR 85 | Temp 97.4°F | Ht 66.5 in | Wt 118.5 lb

## 2014-02-02 DIAGNOSIS — R7309 Other abnormal glucose: Secondary | ICD-10-CM

## 2014-02-02 DIAGNOSIS — E785 Hyperlipidemia, unspecified: Secondary | ICD-10-CM

## 2014-02-02 DIAGNOSIS — R630 Anorexia: Secondary | ICD-10-CM

## 2014-02-02 DIAGNOSIS — Z0000 Encounter for general adult medical examination without abnormal findings: Secondary | ICD-10-CM

## 2014-02-02 DIAGNOSIS — N183 Chronic kidney disease, stage 3 unspecified: Secondary | ICD-10-CM

## 2014-02-02 DIAGNOSIS — G3184 Mild cognitive impairment, so stated: Secondary | ICD-10-CM

## 2014-02-02 DIAGNOSIS — R634 Abnormal weight loss: Secondary | ICD-10-CM | POA: Insufficient documentation

## 2014-02-02 DIAGNOSIS — G309 Alzheimer's disease, unspecified: Secondary | ICD-10-CM

## 2014-02-02 DIAGNOSIS — I10 Essential (primary) hypertension: Secondary | ICD-10-CM

## 2014-02-02 LAB — COMPREHENSIVE METABOLIC PANEL
ALBUMIN: 3.8 g/dL (ref 3.5–5.2)
ALK PHOS: 50 U/L (ref 39–117)
ALT: 20 U/L (ref 0–53)
AST: 21 U/L (ref 0–37)
BILIRUBIN TOTAL: 1 mg/dL (ref 0.3–1.2)
BUN: 22 mg/dL (ref 6–23)
CO2: 29 mEq/L (ref 19–32)
CREATININE: 1.6 mg/dL — AB (ref 0.4–1.5)
Calcium: 9.5 mg/dL (ref 8.4–10.5)
Chloride: 104 mEq/L (ref 96–112)
GFR: 51.2 mL/min — ABNORMAL LOW (ref >60.00)
GLUCOSE: 99 mg/dL (ref 70–99)
POTASSIUM: 4.4 meq/L (ref 3.5–5.1)
Sodium: 141 mEq/L (ref 135–145)
Total Protein: 7.4 g/dL (ref 6.0–8.3)

## 2014-02-02 MED ORDER — MIRTAZAPINE 15 MG PO TABS: 15.0000 mg | ORAL_TABLET | Freq: Every day | ORAL | Status: DC

## 2014-02-02 NOTE — Telephone Encounter (Signed)
Relevant patient education mailed to patient.  

## 2014-02-02 NOTE — Addendum Note (Signed)
Addended by: Kerby NoraBEDSOLE, AMY E on: 02/02/2014 11:29 AM   Modules accepted: Orders

## 2014-02-02 NOTE — Assessment & Plan Note (Signed)
Well controlled. Continue current medication.  

## 2014-02-02 NOTE — Progress Notes (Signed)
HPI  I have personally reviewed the Medicare Annual Wellness questionnaire and have noted  1. The patient's medical and social history  2. Their use of alcohol, tobacco or illicit drugs  3. Their current medications and supplements  4. The patient's functional ability including ADL's, fall risks, home safety risks and hearing or visual  impairment.  5. Diet and physical activities  6. Evidence for depression or mood disorders  The patients weight, height, BMI and visual acuity have been recorded in the chart  I have made referrals, counseling and provided education to the patient based review of the above and I have provided the pt with a written personalized care plan for preventive services.   Doing well overall.   Dementia, mild: Started on aricept last summer due to memory decrease noted by family and pt.  His wife today feels that his memory has continued to decline. Forgetting if eating and if if took medication. Has great  Recall of childhood, but forgets recent events.  He has lost a lot of weight. He has a lot of gas and frequent BM, not diarrhea.  Poor appetite. Skips meals all the time. They have noticed it since starting aricept. No incontinence. Wt Readings from Last 3 Encounters:  02/02/14 118 lb 8 oz (53.751 kg)  05/05/13 138 lb 12 oz (62.937 kg)  11/25/12 140 lb 8 oz (63.73 kg)  No abdominal pain., no nausea, no emesis, no blood in stool, no GERD. No depression, no anxiety. " I am happy"   Previously well controlled cholesterol on no med. No reason to check yearly given age.  Due for CMET given on meds and hx of prediabetes Lab Results  Component Value Date   CHOL 160 11/16/2012   HDL 46.40 11/16/2012   LDLCALC 96 11/16/2012   TRIG 89.0 11/16/2012   CHOLHDL 3 11/16/2012   Hypertension:  Good control  here today on coreg and lisinopril HCTZ  BP Readings from Last 3 Encounters:  05/05/13 160/80  11/25/12 130/84  05/21/12 140/80  Using medication without problems  or lightheadedness: None Chest pain with exertion:None Edema:ZNone Short of breath:None Average home BPs: well controlled per pt. Other issues:  Review of Systems  Constitutional: Negative for fever and fatigue.  HENT: Negative for ear pain.  Eyes: Negative for pain.  Respiratory: Negative for shortness of breath.  Cardiovascular: Negative for chest pain, palpitations and leg swelling.  Gastrointestinal: Negative for abdominal pain, diarrhea, constipation and blood in stool.  Genitourinary: Negative for dysuria, frequency, discharge, penile pain and testicular pain.  Skin: Negative for rash.  Psychiatric/Behavioral: Negative for suicidal ideas, behavioral problems, dysphoric mood and agitation.  Objective:   Physical Exam  Constitutional: He appears well-developed and well-nourished. Non-toxic appearance. He does not appear ill. No distress.  Elderly male..using no support device  HENT:  Head: Normocephalic and atraumatic.  Right Ear: Hearing, tympanic membrane, external ear and ear canal normal.  Left Ear: Hearing, tympanic membrane, external ear and ear canal normal.  Nose: Nose normal.  Mouth/Throat: Uvula is midline, oropharynx is clear and moist and mucous membranes are normal.  Eyes: Conjunctivae, EOM and lids are normal. Pupils are equal, round, and reactive to light. No foreign bodies found.  Neck: Trachea normal, normal range of motion and phonation normal. Neck supple. Carotid bruit is not present. No mass and no thyromegaly present.  Cardiovascular: Normal rate, regular rhythm, S1 normal, S2 normal, intact distal pulses and normal pulses. Exam reveals no gallop.  No murmur heard.  Pulmonary/Chest: Breath sounds normal. He has no wheezes. He has no rhonchi. He has no rales.  Mobile nodule in left lower rib cage.. Evaluated with X-ray years ago per pt, normal,  Abdominal: Soft. Normal appearance and bowel sounds are normal. There is no hepatosplenomegaly. There is no  tenderness. There is no rebound, no guarding and no CVA tenderness. No hernia.  Lymphadenopathy:  He has no cervical adenopathy.  Neurological: He is alert. He has normal strength and normal reflexes. No cranial nerve deficit or sensory deficit. Gait normal.  Skin: Skin is warm, dry and intact. No rash noted.  Psychiatric: He has a normal mood and affect. His speech is normal and behavior is normal. Judgment normal.  Assessment & Plan:   Annual Medicare Wellness: The patient's preventative maintenance and recommended screening tests for an annual wellness exam were reviewed in full today.  Brought up to date unless services declined.  Counselled on the importance of diet, exercise, and its role in overall health and mortality.  The patient's FH and SH was reviewed, including their home life, tobacco status, and drug and alcohol status.   Vaccines: Due for prevnar and shingles. He refuses both.  No indication for prostate or coloon cancer screening.

## 2014-02-02 NOTE — Progress Notes (Signed)
Pre visit review using our clinic review tool, if applicable. No additional management support is needed unless otherwise documented below in the visit note. 

## 2014-02-02 NOTE — Assessment & Plan Note (Signed)
Start remeron. Follow up in 1 months.  Stop aricept as may be contributing to GI SE.

## 2014-02-02 NOTE — Patient Instructions (Addendum)
Stop aricept for now ( memory medication. Start mirtazapine  (appetitie stimulant). We may need to increase this dose if not initially effective. Follow up for a weight check in 1 month. Try to eat 3 meals a day, and consider Boost supplements with meals. Stop at lab on way out.

## 2014-02-08 ENCOUNTER — Emergency Department (HOSPITAL_COMMUNITY)
Admission: EM | Admit: 2014-02-08 | Discharge: 2014-02-08 | Disposition: A | Discharge status: Home / Self Care | Payer: Medicare HMO | Attending: Emergency Medicine | Admitting: Emergency Medicine

## 2014-02-08 ENCOUNTER — Emergency Department (HOSPITAL_COMMUNITY): Payer: Medicare HMO

## 2014-02-08 ENCOUNTER — Other Ambulatory Visit: Payer: Self-pay

## 2014-02-08 ENCOUNTER — Encounter (HOSPITAL_COMMUNITY): Payer: Self-pay | Admitting: Emergency Medicine

## 2014-02-08 DIAGNOSIS — Z7982 Long term (current) use of aspirin: Secondary | ICD-10-CM | POA: Insufficient documentation

## 2014-02-08 DIAGNOSIS — E785 Hyperlipidemia, unspecified: Secondary | ICD-10-CM | POA: Insufficient documentation

## 2014-02-08 DIAGNOSIS — M129 Arthropathy, unspecified: Secondary | ICD-10-CM | POA: Insufficient documentation

## 2014-02-08 DIAGNOSIS — F0390 Unspecified dementia without behavioral disturbance: Secondary | ICD-10-CM

## 2014-02-08 DIAGNOSIS — F0280 Dementia in other diseases classified elsewhere without behavioral disturbance: Secondary | ICD-10-CM | POA: Insufficient documentation

## 2014-02-08 DIAGNOSIS — R5381 Other malaise: Secondary | ICD-10-CM | POA: Insufficient documentation

## 2014-02-08 DIAGNOSIS — Z8673 Personal history of transient ischemic attack (TIA), and cerebral infarction without residual deficits: Secondary | ICD-10-CM | POA: Insufficient documentation

## 2014-02-08 DIAGNOSIS — G309 Alzheimer's disease, unspecified: Secondary | ICD-10-CM | POA: Insufficient documentation

## 2014-02-08 DIAGNOSIS — I129 Hypertensive chronic kidney disease with stage 1 through stage 4 chronic kidney disease, or unspecified chronic kidney disease: Secondary | ICD-10-CM | POA: Insufficient documentation

## 2014-02-08 DIAGNOSIS — N189 Chronic kidney disease, unspecified: Secondary | ICD-10-CM | POA: Insufficient documentation

## 2014-02-08 DIAGNOSIS — I10 Essential (primary) hypertension: Secondary | ICD-10-CM

## 2014-02-08 DIAGNOSIS — E860 Dehydration: Secondary | ICD-10-CM | POA: Insufficient documentation

## 2014-02-08 DIAGNOSIS — R5383 Other fatigue: Secondary | ICD-10-CM

## 2014-02-08 DIAGNOSIS — Z79899 Other long term (current) drug therapy: Secondary | ICD-10-CM | POA: Insufficient documentation

## 2014-02-08 LAB — CBC WITH DIFFERENTIAL/PLATELET
Basophils Absolute: 0 10*3/uL (ref 0.0–0.1)
Basophils Relative: 0 % (ref 0–1)
EOS PCT: 1 % (ref 0–5)
Eosinophils Absolute: 0.1 10*3/uL (ref 0.0–0.7)
HCT: 43.6 % (ref 39.0–52.0)
Hemoglobin: 14.9 g/dL (ref 13.0–17.0)
LYMPHS ABS: 1.2 10*3/uL (ref 0.7–4.0)
LYMPHS PCT: 19 % (ref 12–46)
MCH: 31.1 pg (ref 26.0–34.0)
MCHC: 34.2 g/dL (ref 30.0–36.0)
MCV: 91 fL (ref 78.0–100.0)
Monocytes Absolute: 0.5 10*3/uL (ref 0.1–1.0)
Monocytes Relative: 7 % (ref 3–12)
Neutro Abs: 4.5 10*3/uL (ref 1.7–7.7)
Neutrophils Relative %: 73 % (ref 43–77)
PLATELETS: 210 10*3/uL (ref 150–400)
RBC: 4.79 MIL/uL (ref 4.22–5.81)
RDW: 13.6 % (ref 11.5–15.5)
WBC: 6.2 10*3/uL (ref 4.0–10.5)

## 2014-02-08 LAB — URINE MICROSCOPIC-ADD ON

## 2014-02-08 LAB — COMPREHENSIVE METABOLIC PANEL
ALK PHOS: 60 U/L (ref 39–117)
ALT: 23 U/L (ref 0–53)
AST: 22 U/L (ref 0–37)
Albumin: 3.3 g/dL — ABNORMAL LOW (ref 3.5–5.2)
BUN: 19 mg/dL (ref 6–23)
CALCIUM: 9.3 mg/dL (ref 8.4–10.5)
CO2: 27 meq/L (ref 19–32)
Chloride: 105 mEq/L (ref 96–112)
Creatinine, Ser: 1.58 mg/dL — ABNORMAL HIGH (ref 0.50–1.35)
GFR, EST AFRICAN AMERICAN: 42 mL/min — AB (ref >90)
GFR, EST NON AFRICAN AMERICAN: 37 mL/min — AB (ref >90)
GLUCOSE: 118 mg/dL — AB (ref 70–99)
POTASSIUM: 4.6 meq/L (ref 3.7–5.3)
SODIUM: 143 meq/L (ref 137–147)
Total Bilirubin: 0.7 mg/dL (ref 0.3–1.2)
Total Protein: 7.1 g/dL (ref 6.0–8.3)

## 2014-02-08 LAB — URINALYSIS, ROUTINE W REFLEX MICROSCOPIC
BILIRUBIN URINE: NEGATIVE
Glucose, UA: NEGATIVE mg/dL
Ketones, ur: NEGATIVE mg/dL
Leukocytes, UA: NEGATIVE
Nitrite: NEGATIVE
PROTEIN: 100 mg/dL — AB
Specific Gravity, Urine: 1.018 (ref 1.005–1.030)
UROBILINOGEN UA: 1 mg/dL (ref 0.0–1.0)
pH: 8 (ref 5.0–8.0)

## 2014-02-08 LAB — TROPONIN I: Troponin I: <0.3 ng/mL (ref <0.30)

## 2014-02-08 MED ORDER — SODIUM CHLORIDE 0.9 % IV SOLN
Freq: Once | INTRAVENOUS | Status: AC
  Administered 2014-02-08: 12:00:00 via INTRAVENOUS

## 2014-02-08 NOTE — ED Notes (Signed)
Family states that pt did not take his B/p meds this am

## 2014-02-08 NOTE — ED Provider Notes (Signed)
CSN: [id number]     Arrival date & time 02/08/14  1048 History   First MD Initiated Contact with Patient 02/08/14 1100     Chief Complaint  Patient presents with  . Dizziness  . Weakness     (Consider location/radiation/quality/duration/timing/severity/associated sxs/prior Treatment) Patient is a 80 y.o. male presenting with dizziness and weakness. The history is provided by the spouse (the pt has had some weakness and confusion today.  also some dizziness).  Dizziness Quality:  Lightheadedness Severity:  Mild Onset quality:  Sudden Timing:  Rare Progression:  Improving Chronicity:  New Context: head movement   Relieved by:  Nothing Associated symptoms: no chest pain, no diarrhea and no headaches   Weakness Pertinent negatives include no chest pain, no abdominal pain and no headaches.    Past Medical History  Diagnosis Date  . Stroke   . Hypertension   . Hyperlipidemia   . Arthritis   . Chronic kidney disease   . Mild neurocognitive disorder due to Alzheimer's disease 05/05/2013   History reviewed. No pertinent past surgical history. Family History  Problem Relation Age of Onset  . Heart attack Father    History  Substance Use Topics  . Smoking status: Never Smoker   . Smokeless tobacco: Never Used  . Alcohol Use: No    Review of Systems  Constitutional: Negative for appetite change and fatigue.  HENT: Negative for congestion, ear discharge and sinus pressure.   Eyes: Negative for discharge.  Respiratory: Negative for cough.   Cardiovascular: Negative for chest pain.  Gastrointestinal: Negative for abdominal pain and diarrhea.  Genitourinary: Negative for frequency and hematuria.  Musculoskeletal: Negative for back pain.  Skin: Negative for rash.  Neurological: Positive for dizziness, weakness and light-headedness. Negative for seizures and headaches.  Psychiatric/Behavioral: Negative for hallucinations.      Allergies  Review of patient's allergies  indicates no known allergies.  Home Medications   Prior to Admission medications   Medication Sig Start Date End Date Taking? Authorizing Provider  aspirin 81 MG tablet Take 81 mg by mouth daily.    Yes Historical Provider, MD  carvedilol (COREG) 6.25 MG tablet Take 6.25 mg by mouth 2 (two) times daily with a meal.   Yes Historical Provider, MD  donepezil (ARICEPT) 5 MG tablet Take 1 tablet (5 mg total) by mouth at bedtime as needed. 05/05/13  Yes Hannah BeatSpencer Copland, MD  lisinopril-hydrochlorothiazide (PRINZIDE,ZESTORETIC) 20-12.5 MG per tablet Take two tablets by mouth daily 10/17/13  Yes Amy E Bedsole, MD  mirtazapine (REMERON) 15 MG tablet Take 1 tablet (15 mg total) by mouth at bedtime. 02/02/14  Yes Amy Michelle NasutiE Bedsole, MD  Multiple Vitamin (MULTIVITAMIN) tablet Take 1 tablet by mouth daily.     Yes Historical Provider, MD   BP 177/118  Pulse 87  Temp(Src) 97.5 F (36.4 C) (Oral)  Resp 33  Ht 5\' 6"  (1.676 m)  Wt 118 lb 8 oz (53.751 kg)  BMI 19.14 kg/m2  SpO2 97% Physical Exam  Constitutional: He is oriented to person, place, and time. He appears well-developed.  HENT:  Head: Normocephalic.  Eyes: Conjunctivae and EOM are normal. No scleral icterus.  Neck: Neck supple. No thyromegaly present.  Cardiovascular: Normal rate and regular rhythm.  Exam reveals no gallop and no friction rub.   No murmur heard. Pulmonary/Chest: No stridor. He has no wheezes. He has no rales. He exhibits no tenderness.  Abdominal: He exhibits no distension. There is no tenderness. There is no rebound.  Musculoskeletal:  Normal range of motion. He exhibits no edema.  Lymphadenopathy:    He has no cervical adenopathy.  Neurological: He is oriented to person, place, and time. He exhibits normal muscle tone. Coordination normal.  Skin: No rash noted. No erythema.  Psychiatric: He has a normal mood and affect. His behavior is normal.    ED Course  Procedures (including critical care time) Labs Review Labs  Reviewed  COMPREHENSIVE METABOLIC PANEL - Abnormal; Notable for the following:    Glucose, Bld 118 (*)    Creatinine, Ser 1.58 (*)    Albumin 3.3 (*)    GFR calc non Af Amer 37 (*)    GFR calc Af Amer 42 (*)    All other components within normal limits  URINALYSIS, ROUTINE W REFLEX MICROSCOPIC - Abnormal; Notable for the following:    Hgb urine dipstick MODERATE (*)    Protein, ur 100 (*)    All other components within normal limits  URINE MICROSCOPIC-ADD ON - Abnormal; Notable for the following:    Squamous Epithelial / LPF FEW (*)    Casts HYALINE CASTS (*)    All other components within normal limits  CBC WITH DIFFERENTIAL  TROPONIN I    Imaging Review Ct Head Wo Contrast  02/08/2014   CLINICAL DATA:  Dizziness.  Weakness.  Altered mental status.  EXAM: CT HEAD WITHOUT CONTRAST  TECHNIQUE: Contiguous axial images were obtained from the base of the skull through the vertex without intravenous contrast.  COMPARISON:  None.  FINDINGS: There is no evidence of intracranial hemorrhage, brain edema, or other signs of acute infarction. There is no evidence of intracranial mass lesion or mass effect. No abnormal extraaxial fluid collections are identified.  Mild diffuse cerebral atrophy noted. Moderate chronic small vessel disease also demonstrated. No evidence hydrocephalus. No skull abnormality identified.  IMPRESSION: No acute intracranial abnormality.  Cerebral atrophy and chronic small vessel disease.   Electronically Signed   By: Myles RosenthalJohn  Stahl M.D.   On: 02/08/2014 13:59   Dg Chest Port 1 View  02/08/2014   CLINICAL DATA:  Weakness, confusion, history hypertension, stroke, Alzheimer's disease  EXAM: PORTABLE CHEST - 1 VIEW  COMPARISON:  Portable exam 1136 hr compared to 05/26/2008  FINDINGS: Enlargement of cardiac silhouette.  Mediastinal contours and pulmonary vascularity normal.  Rotated to the right.  Bilateral perihilar infiltrates, slightly greater on right, question pulmonary edema.  No  pleural effusion or pneumothorax.  Bones demineralized with right glenohumeral degenerative changes and scattered endplate spur formation and scoliosis of the thoracic spine.  IMPRESSION: Question CHF.   Electronically Signed   By: Ulyses SouthwardMark  Boles M.D.   On: 02/08/2014 11:59     EKG Interpretation   Date/Time:  Wednesday February 08 2014 10:53:25 EDT Ventricular Rate:  86 PR Interval:  302 QRS Duration: 80 QT Interval:  382 QTC Calculation: 457 R Axis:   74 Text Interpretation:  Sinus rhythm with 1st degree A-V block Possible Left  atrial enlargement Septal infarct , age undetermined Abnormal ECG      MDM   Final diagnoses:  Dementia  HTN (hypertension)    Htn,  Mild dementia,  Dehydration      Benny LennertJoseph L Nevia Henkin, MD 02/08/14 1430

## 2014-02-08 NOTE — Discharge Instructions (Signed)
Drink plenty of fluids and follow up with your md next week for recheck 

## 2014-02-08 NOTE — ED Notes (Signed)
Returned from ct scan 

## 2014-02-08 NOTE — ED Notes (Signed)
Patient transported to CT 

## 2014-02-08 NOTE — ED Notes (Signed)
Pt's wife states pt is "talking out of his head".  Pt is answering questions appropriately in triage.  Pt c/o dizziness and weakness.  Pt's wife states she thinks pt is dehydrated because he doesn't drink enough water.

## 2014-02-23 ENCOUNTER — Other Ambulatory Visit: Payer: Self-pay | Admitting: Family Medicine

## 2014-03-06 ENCOUNTER — Ambulatory Visit: Payer: Medicare Other | Admitting: Family Medicine

## 2014-03-07 ENCOUNTER — Ambulatory Visit (INDEPENDENT_AMBULATORY_CARE_PROVIDER_SITE_OTHER): Payer: Commercial Managed Care - HMO | Admitting: Family Medicine

## 2014-03-07 ENCOUNTER — Encounter: Payer: Self-pay | Admitting: Family Medicine

## 2014-03-07 VITALS — BP 140/80 | HR 83 | Temp 98.5°F | Ht 66.5 in | Wt 119.5 lb

## 2014-03-07 DIAGNOSIS — R630 Anorexia: Secondary | ICD-10-CM

## 2014-03-07 DIAGNOSIS — I10 Essential (primary) hypertension: Secondary | ICD-10-CM

## 2014-03-07 DIAGNOSIS — R7309 Other abnormal glucose: Secondary | ICD-10-CM

## 2014-03-07 DIAGNOSIS — F03B0 Unspecified dementia, moderate, without behavioral disturbance, psychotic disturbance, mood disturbance, and anxiety: Secondary | ICD-10-CM

## 2014-03-07 DIAGNOSIS — F0390 Unspecified dementia without behavioral disturbance: Secondary | ICD-10-CM

## 2014-03-07 MED ORDER — MIRTAZAPINE 30 MG PO TABS: 15.0000 mg | ORAL_TABLET | Freq: Every day | ORAL | Status: AC

## 2014-03-07 NOTE — Progress Notes (Signed)
Pre visit review using our clinic review tool, if applicable. No additional management support is needed unless otherwise documented below in the visit note. 

## 2014-03-07 NOTE — Assessment & Plan Note (Signed)
Increase remeron to 30 mg daily. Follow up in 1 month.

## 2014-03-07 NOTE — Assessment & Plan Note (Signed)
Aricetp stopped as no benefit and SE to GI [possible... Consider restarting if  Weight loss/ decreased appetitie not from this.

## 2014-03-07 NOTE — Patient Instructions (Addendum)
Increase mirtazapine to 30 mg daily (Start by using upo 15 mg tablets by taking 2 tabs at bedtime, then get new prescription for the higher dose tablets) Follow up in 63month failure to thrive. Continue to hold aricept for now, we may restart at later time if appetite is improivng

## 2014-03-07 NOTE — Assessment & Plan Note (Signed)
Well controlled. Continue current medication.  

## 2014-03-07 NOTE — Progress Notes (Signed)
   Subjective:    Patient ID: Mike Walton, male    DOB: Sep 08, 1920, 80 y.o.   MRN: [id number]  HPI  80 year old male with history of HTN and dementia presents for follow up on abnormal weight loss and decreased appettie.  At appt 1 month ago he was started on remeron. Not on aricept because this was held due to possible appetite SE and pt concerns.  On 4/15 he had an ER visit for dizziness, labs, EKG,  head CT and CXR were normal. BP was elevated.  No further dizziness.  No chest pain.  He has not lost any further weight since last OV.  BP Readings from Last 3 Encounters:  03/07/14 140/80  02/08/14 177/118  02/02/14 130/86    Wt Readings from Last 3 Encounters:  03/07/14 119 lb 8 oz (54.205 kg)  02/08/14 118 lb 8 oz (53.751 kg)  02/02/14 118 lb 8 oz (53.751 kg)     Review of Systems  Constitutional: Negative for fever, fatigue and unexpected weight change.  HENT: Negative for ear pain.   Eyes: Negative for pain.  Respiratory: Negative for cough and shortness of breath.   Cardiovascular: Negative for chest pain.  Gastrointestinal: Negative for abdominal pain.       Objective:   Physical Exam  Constitutional: Vital signs are normal. He appears well-developed and well-nourished.  Elderly male in NAD.  HENT:  Head: Normocephalic.  Right Ear: Hearing normal.  Left Ear: Hearing normal.  Nose: Nose normal.  Mouth/Throat: Oropharynx is clear and moist and mucous membranes are normal.  Neck: Trachea normal. Carotid bruit is not present. No mass and no thyromegaly present.  Cardiovascular: Normal rate, regular rhythm and normal pulses.  Exam reveals no gallop, no distant heart sounds and no friction rub.   No murmur heard. No peripheral edema  Pulmonary/Chest: Effort normal and breath sounds normal. No respiratory distress.  Skin: Skin is warm, dry and intact. No rash noted.  Psychiatric: He has a normal mood and affect. His speech is normal and behavior is normal.  Thought content normal.          Assessment & Plan:

## 2014-03-07 NOTE — Assessment & Plan Note (Signed)
Resolved

## 2014-03-14 ENCOUNTER — Other Ambulatory Visit: Payer: Self-pay | Admitting: Family Medicine

## 2014-03-15 ENCOUNTER — Other Ambulatory Visit: Payer: Self-pay | Admitting: *Deleted

## 2014-03-15 MED ORDER — LISINOPRIL-HYDROCHLOROTHIAZIDE 20-12.5 MG PO TABS: ORAL_TABLET | ORAL | Status: AC

## 2014-03-15 NOTE — Telephone Encounter (Signed)
Requesting 90 day supply.

## 2014-04-11 ENCOUNTER — Encounter: Payer: Self-pay | Admitting: Family Medicine

## 2014-04-11 ENCOUNTER — Ambulatory Visit (INDEPENDENT_AMBULATORY_CARE_PROVIDER_SITE_OTHER): Payer: Commercial Managed Care - HMO | Admitting: Family Medicine

## 2014-04-11 VITALS — BP 138/74 | HR 78 | Temp 98.0°F | Wt 121.5 lb

## 2014-04-11 DIAGNOSIS — F0390 Unspecified dementia without behavioral disturbance: Secondary | ICD-10-CM

## 2014-04-11 DIAGNOSIS — R630 Anorexia: Secondary | ICD-10-CM

## 2014-04-11 DIAGNOSIS — F03B0 Unspecified dementia, moderate, without behavioral disturbance, psychotic disturbance, mood disturbance, and anxiety: Secondary | ICD-10-CM

## 2014-04-11 DIAGNOSIS — R634 Abnormal weight loss: Secondary | ICD-10-CM

## 2014-04-11 NOTE — Assessment & Plan Note (Signed)
Resolved

## 2014-04-11 NOTE — Progress Notes (Signed)
Pre visit review using our clinic review tool, if applicable. No additional management support is needed unless otherwise documented below in the visit note. 

## 2014-04-11 NOTE — Progress Notes (Signed)
   Subjective:    Patient ID: Mike Walton, male    DOB: February 07, 1920, 80 y.o.   MRN: [id number]  HPI 80 year old male presents for follow up dementia and poor appetite, FTT.  At last OV remeron increased to 30 mg daily.   No associated SE.   Aricept stopped as no benefit and SE to GI possible... Consider restarting if  weight loss/ decreased appetitie not from this.   Wt Readings from Last 3 Encounters:  04/11/14 121 lb 8 oz (55.112 kg)  03/07/14 119 lb 8 oz (54.205 kg)  02/08/14 118 lb 8 oz (53.751 kg)   He reports no stomach upset, no D/C. He is sleeping more than previously per wife. Sleeping okay at night as well.  Wife reports no change in memory, stable.       Review of Systems  Constitutional: Negative for fever, fatigue and unexpected weight change.  HENT: Negative for ear pain.   Eyes: Negative for pain.  Respiratory: Negative for cough and shortness of breath.   Cardiovascular: Negative for chest pain.  Gastrointestinal: Negative for abdominal pain.       Objective:   Physical Exam  Constitutional: Vital signs are normal. He appears well-developed and well-nourished.  Elderly male in NAD.  HENT:  Head: Normocephalic.  Right Ear: Hearing normal.  Left Ear: Hearing normal.  Nose: Nose normal.  Mouth/Throat: Oropharynx is clear and moist and mucous membranes are normal.  Neck: Trachea normal. Carotid bruit is not present. No mass and no thyromegaly present.  Cardiovascular: Normal rate, regular rhythm and normal pulses.  Exam reveals no gallop, no distant heart sounds and no friction rub.   No murmur heard. No peripheral edema  Pulmonary/Chest: Effort normal and breath sounds normal. No respiratory distress.  Skin: Skin is warm, dry and intact. No rash noted.  Psychiatric: He has a normal mood and affect. His speech is normal and behavior is normal. Thought content normal.          Assessment & Plan:

## 2014-04-11 NOTE — Assessment & Plan Note (Signed)
Repeat MMSE in 3 onth, consider restart aricept at that time. Stable dementia per wife report.

## 2014-04-11 NOTE — Assessment & Plan Note (Signed)
Improving on remeron. Some SE of increase sleep, minimal. Continue as improving weight. Consider D/C in 3 months.

## 2014-04-11 NOTE — Patient Instructions (Signed)
Continue remeron. Follow up in 3 months for MMSE and recheck dementia, appetite..  Work on walking daily some.

## 2014-06-13 ENCOUNTER — Other Ambulatory Visit: Payer: Self-pay | Admitting: Family Medicine

## 2014-06-13 IMAGING — CR DG CHEST 1V PORT
1 series · 1 of 1 positions shown · non-contrast
Comparison: Portable exam 2209 hr compared to 05/26/2008

CLINICAL DATA: Weakness, confusion, history hypertension, stroke,
Alzheimer's disease

EXAM:
PORTABLE CHEST - 1 VIEW

[AP]
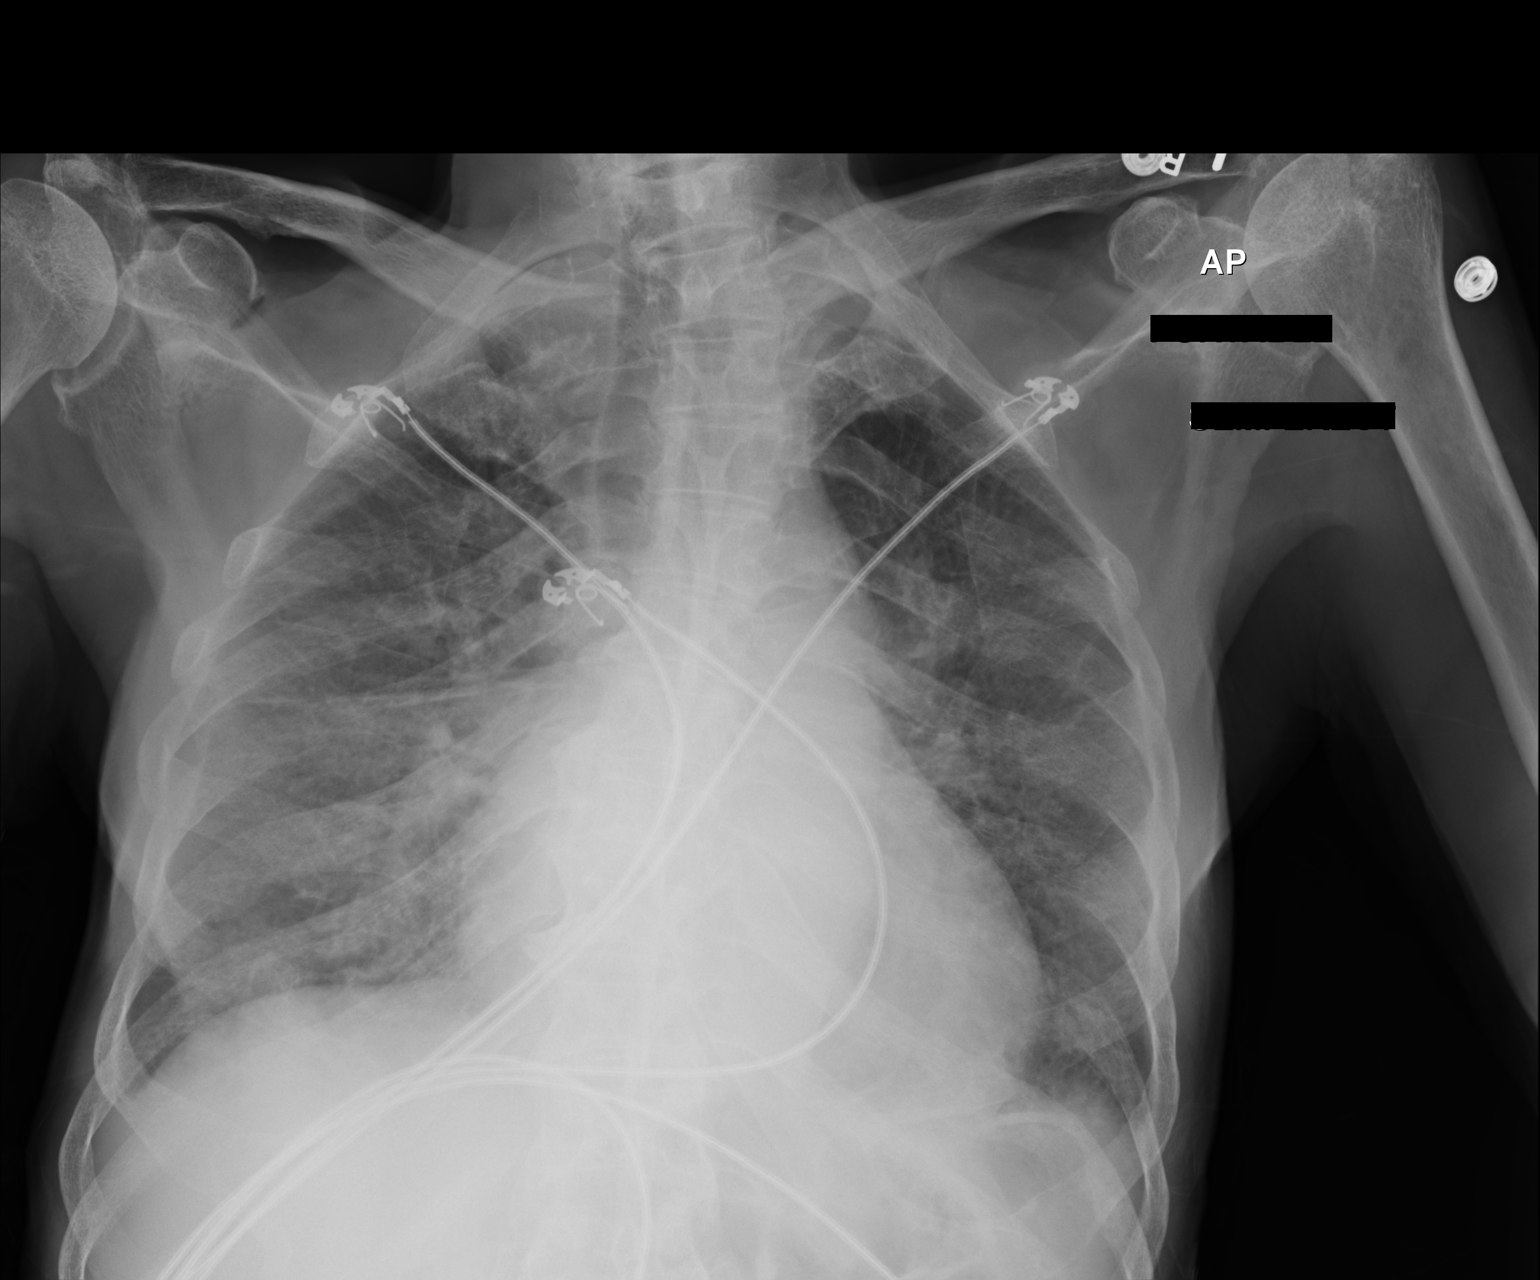

[1 of 1 positions shown; findings below may reference images not displayed]

FINDINGS: Enlargement of cardiac silhouette.

Mediastinal contours and pulmonary vascularity normal.

Rotated to the right.

Bilateral perihilar infiltrates, slightly greater on right, question
pulmonary edema.

No pleural effusion or pneumothorax.

Bones demineralized with right glenohumeral degenerative changes and
scattered endplate spur formation and scoliosis of the thoracic
spine.
IMPRESSION: Question CHF.

## 2014-06-22 ENCOUNTER — Encounter: Payer: Self-pay | Admitting: *Deleted

## 2014-06-27 DEATH — deceased

## 2014-07-14 ENCOUNTER — Ambulatory Visit: Payer: Commercial Managed Care - HMO | Admitting: Family Medicine
